# Patient Record
Sex: Female | Born: 1970
Health system: Southern US, Community
[De-identification: ages and names within clinical notes are randomized; demographics above are authoritative.]

## PROBLEM LIST (undated history)

## (undated) DIAGNOSIS — D869 Sarcoidosis, unspecified: Secondary | ICD-10-CM

## (undated) DIAGNOSIS — I10 Essential (primary) hypertension: Secondary | ICD-10-CM

## (undated) HISTORY — PX: CORNEAL TRANSPLANT: SHX108

## (undated) HISTORY — PX: TUBAL LIGATION: SHX77

---

## 2015-12-30 DIAGNOSIS — Z1231 Encounter for screening mammogram for malignant neoplasm of breast: Secondary | ICD-10-CM | POA: Diagnosis not present

## 2016-01-11 DIAGNOSIS — N3941 Urge incontinence: Secondary | ICD-10-CM | POA: Diagnosis not present

## 2016-01-11 DIAGNOSIS — R5383 Other fatigue: Secondary | ICD-10-CM | POA: Diagnosis not present

## 2016-01-14 DIAGNOSIS — N920 Excessive and frequent menstruation with regular cycle: Secondary | ICD-10-CM | POA: Diagnosis not present

## 2016-02-04 DIAGNOSIS — N760 Acute vaginitis: Secondary | ICD-10-CM | POA: Diagnosis not present

## 2016-02-24 DIAGNOSIS — B379 Candidiasis, unspecified: Secondary | ICD-10-CM | POA: Diagnosis not present

## 2016-02-24 DIAGNOSIS — B372 Candidiasis of skin and nail: Secondary | ICD-10-CM | POA: Diagnosis not present

## 2016-06-30 DIAGNOSIS — B373 Candidiasis of vulva and vagina: Secondary | ICD-10-CM | POA: Diagnosis not present

## 2016-06-30 DIAGNOSIS — Z7251 High risk heterosexual behavior: Secondary | ICD-10-CM | POA: Diagnosis not present

## 2016-06-30 DIAGNOSIS — I10 Essential (primary) hypertension: Secondary | ICD-10-CM | POA: Diagnosis not present

## 2016-09-25 DIAGNOSIS — S0502XA Injury of conjunctiva and corneal abrasion without foreign body, left eye, initial encounter: Secondary | ICD-10-CM | POA: Diagnosis not present

## 2016-09-25 DIAGNOSIS — H5712 Ocular pain, left eye: Secondary | ICD-10-CM | POA: Diagnosis not present

## 2016-09-25 DIAGNOSIS — I10 Essential (primary) hypertension: Secondary | ICD-10-CM | POA: Diagnosis not present

## 2016-09-25 DIAGNOSIS — D869 Sarcoidosis, unspecified: Secondary | ICD-10-CM | POA: Diagnosis not present

## 2016-09-25 DIAGNOSIS — H5711 Ocular pain, right eye: Secondary | ICD-10-CM | POA: Diagnosis not present

## 2017-01-01 DIAGNOSIS — H109 Unspecified conjunctivitis: Secondary | ICD-10-CM | POA: Diagnosis not present

## 2017-01-03 ENCOUNTER — Encounter (HOSPITAL_COMMUNITY): Payer: Self-pay | Admitting: *Deleted

## 2017-01-03 ENCOUNTER — Emergency Department (HOSPITAL_COMMUNITY)
Admission: EM | Admit: 2017-01-03 | Discharge: 2017-01-04 | Disposition: A | Discharge status: Home / Self Care | Payer: Medicare Other | Attending: Emergency Medicine | Admitting: Emergency Medicine

## 2017-01-03 DIAGNOSIS — H209 Unspecified iridocyclitis: Secondary | ICD-10-CM | POA: Insufficient documentation

## 2017-01-03 DIAGNOSIS — I10 Essential (primary) hypertension: Secondary | ICD-10-CM | POA: Diagnosis not present

## 2017-01-03 DIAGNOSIS — H578 Other specified disorders of eye and adnexa: Secondary | ICD-10-CM | POA: Diagnosis present

## 2017-01-03 HISTORY — DX: Essential (primary) hypertension: I10

## 2017-01-03 HISTORY — DX: Sarcoidosis, unspecified: D86.9

## 2017-01-03 MED ORDER — TIMOLOL MALEATE 0.5 % OP SOLN
1.0000 [drp] | Freq: Two times a day (BID) | OPHTHALMIC | Status: DC
  Administered 2017-01-04: 1 [drp] via OPHTHALMIC
  Filled 2017-01-03: qty 5

## 2017-01-03 MED ORDER — TETRACAINE HCL 0.5 % OP SOLN
1.0000 [drp] | Freq: Once | OPHTHALMIC | Status: DC
  Filled 2017-01-03: qty 4

## 2017-01-03 MED ORDER — FLUORESCEIN SODIUM 0.6 MG OP STRP
1.0000 | ORAL_STRIP | Freq: Once | OPHTHALMIC | Status: DC
  Filled 2017-01-03: qty 1

## 2017-01-03 MED ORDER — PREDNISOLONE ACETATE 1 % OP SUSP
1.0000 [drp] | Freq: Once | OPHTHALMIC | Status: AC
  Administered 2017-01-04: 1 [drp] via OPHTHALMIC
  Filled 2017-01-03: qty 1

## 2017-01-03 NOTE — ED Notes (Signed)
Pt reports that her vision is very blurry, unable to see anything definite with her right eye, 20/800 in left eye and both eyes,

## 2017-01-03 NOTE — ED Provider Notes (Signed)
[facility] DEPT Provider Note   CSN: [id number] Arrival date & time: 01/03/17  2258     History   Chief Complaint Chief Complaint  Patient presents with  . Eye Problem    HPI Marie Wright is a 46 y.o. female.  Marie Wright is a 46 y.o. Female with a history of hypertension, sarcoidosis and a left corneal transplant who presents to the emergency department complaining of bilateral eye pain as well as revision, and right eye redness. She reports this has been ongoing for 6 days. She reports she was seen at [facility] ER 6 days ago and diagnosed with conjunctivitis. She was given diclofenac eyedrops and gentamicin eyedrops and reports that this has not helped her symptoms. She reports she feels these have made them worse. She reports her vision is blurry. No double vision. She does not wear contacts or glasses. 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  She has had a left corneal transplant. She is followed by an ophthalmologist in [facility]. She denies eye foreign body sensation. She denies fevers, double vision, headache, or eye injury.    The history is provided by the patient. No language interpreter was used.  Eye Problem   Associated symptoms include eye redness. Pertinent negatives include no photophobia.    Past Medical History:  Diagnosis Date  . Hypertension   . Sarcoidosis ([place])     There are no active problems to display for this patient.   Past Surgical History:  Procedure Laterality Date  . CORNEAL TRANSPLANT Left   . TUBAL LIGATION      OB History    No data available       Home Medications    Prior to Admission medications   Not on File    Family History History reviewed. No pertinent family history.  Social History Social History  Substance Use Topics  . Smoking status: Never Smoker  . Smokeless tobacco: Never Used  . Alcohol use No     Allergies   Patient has no known allergies.   Review  of Systems Review of Systems  Constitutional: Negative for chills and fever.  Eyes: Positive for pain, redness and visual disturbance. Negative for photophobia.  Skin: Negative for rash.  Neurological: Negative for headaches.     Physical Exam Updated Vital Signs BP (!) 180/114 (BP Location: Right Arm)   Pulse 77   Temp 97.8 F (36.6 C) (Oral)   Resp 20   Ht 5\' 2"  (1.575 m)   Wt 89.4 kg   SpO2 95%   BMI 36.03 kg/m   Physical Exam  Constitutional: She appears well-developed and well-nourished. No distress.  HENT:  Head: Normocephalic and atraumatic.  Right Ear: External ear normal.  Left Ear: External ear normal.  Eyes: EOM are normal. Pupils are equal, round, and reactive to light. Right eye exhibits no discharge. Left eye exhibits no discharge.  Right eye conjunctival injection. EOMs intact. No lid edema or erythema. No proptosis. Right consensual pain noted on exam. Right Pupil is minimally reactive.  Bilateral eyes were anesthetized with tetracaine and stained with fluorescein. No forcing uptake on exam. No corneal abrasions or ulcers. Pressures are checked with Tono-Pen. Elevated pressures in her right eye with pressures between 52 and 72 mmHg. Left eye has pressures of 19 mmHg.   Neck: Neck supple.  Pulmonary/Chest: Effort normal. No respiratory distress.  Neurological: She is alert. Coordination normal.  Skin: Skin is warm and dry.  Capillary refill takes less than 2 seconds. No rash noted. She is not diaphoretic. No erythema. No pallor.  Psychiatric: She has a normal mood and affect. Her behavior is normal.  Nursing note and vitals reviewed.    ED Treatments / Results  Labs (all labs ordered are listed, but only abnormal results are displayed) Labs Reviewed - No data to display  EKG  EKG Interpretation None       Radiology No results found.  Procedures Procedures (including critical care time)  Medications Ordered in ED Medications  fluorescein  ophthalmic strip 1 strip (not administered)  tetracaine (PONTOCAINE) 0.5 % ophthalmic solution 1 drop (not administered)  prednisoLONE acetate (PRED FORTE) 1 % ophthalmic suspension 1 drop (not administered)  timolol (TIMOPTIC) 0.5 % ophthalmic solution 1 drop (not administered)  HYDROcodone-acetaminophen (NORCO/VICODIN) 5-325 MG per tablet 2 tablet (not administered)     Initial Impression / Assessment and Plan / ED Course  I have reviewed the triage vital signs and the nursing notes.  Pertinent labs & imaging results that were available during my care of the patient were reviewed by me and considered in my medical decision making (see chart for details).  Clinical Course    This is a 46 y.o. Female with a history of hypertension, sarcoidosis and a left corneal transplant who presents to the emergency department complaining of bilateral eye pain as well as revision, and right eye redness. She reports this has been ongoing for 6 days. She reports she was seen at [facility] ER 6 days ago and diagnosed with conjunctivitis. She was given diclofenac eyedrops and gentamicin eyedrops and reports that this has not helped her symptoms. She reports she feels these have made them worse. She reports her vision is blurry. No double vision. She does not wear contacts or glasses. 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  She has had a left corneal transplant. She is followed by an ophthalmologist in [facility].  On exam the patient is afebrile nontoxic appearing. She is right eye conjunctival injection. No lid erythema or edema. Right pupil is sluggish to respond. Vision is greatly diminished in her right eye. She reports only see shapes. Bilateral eyes were anesthetized with tetracaine and stained with fluorescein. No forcing uptake on exam. No corneal abrasions or ulcers. Patient has elevated pressures in her right eye. Pressures between 52 and 72 mmHg obtained. Left eye with  her corneal transplant has normal pressures of 19 mmHg. I consulted with ophthalmologist Dr. Alanda Slim. He believes the patient has uveitis. He would like her to use Timolol eye drops twice a day and predisolone eye drops four times per day. He will see her in office first thing in the morning. Patient agrees with this plan. Patient provided with eye drops in the ER tonight and discharged home with bottles and instructions. I advised the patient to follow-up with their primary care provider this week. I advised the patient to return to the emergency department with new or worsening symptoms or new concerns. The patient verbalized understanding and agreement with plan.   This patient was discussed with and evaluated by Dr. Christy Gentles who agrees with assessment and plan.   Final Clinical Impressions(s) / ED Diagnoses   Final diagnoses:  Uveitis    New Prescriptions New Prescriptions   No medications on file     Waynetta Pean, PA-C 01/04/17 0106    Ripley Fraise, MD 01/04/17 [phone]

## 2017-01-03 NOTE — ED Triage Notes (Signed)
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

## 2017-01-04 DIAGNOSIS — H209 Unspecified iridocyclitis: Secondary | ICD-10-CM | POA: Diagnosis not present

## 2017-01-04 MED ORDER — PREDNISOLONE ACETATE 1 % OP SUSP
OPHTHALMIC | Status: AC
  Filled 2017-01-04: qty 5

## 2017-01-04 MED ORDER — HYDROCODONE-ACETAMINOPHEN 5-325 MG PO TABS
2.0000 | ORAL_TABLET | Freq: Once | ORAL | Status: AC
Start: 2017-01-04 — End: 2017-01-04
  Administered 2017-01-04: 2 via ORAL
  Filled 2017-01-04: qty 2

## 2017-01-04 NOTE — Discharge Instructions (Signed)
Please use Timolol drops twice a day in your right eye.  Please use Prednisolone drops four times a day in your right eye.

## 2017-01-04 NOTE — ED Provider Notes (Signed)
Patient seen/examined in the Emergency Department in conjunction with Midlevel Provider Dans[place] Patient reports with right eye pain for several days Exam : awake/alert, right eye is erythematous, pupil minimally reactive and cloudy Plan: discussion with opthalmology dr Alanda Slim by [place] to treat with pred forte, timolol and he will see patient later this morning.  His suspicion is this represents acute uveitis Pt updated on plan She is awake/alert, comfortable appearing at this time     Ripley Fraise, MD 01/04/17 0022

## 2017-01-05 DIAGNOSIS — H40031 Anatomical narrow angle, right eye: Secondary | ICD-10-CM | POA: Diagnosis not present

## 2017-01-12 DIAGNOSIS — H209 Unspecified iridocyclitis: Secondary | ICD-10-CM | POA: Diagnosis not present

## 2017-01-17 ENCOUNTER — Ambulatory Visit: Payer: Medicare Other | Attending: Internal Medicine | Admitting: Physician Assistant

## 2017-01-17 VITALS — BP 145/92 | HR 90 | Temp 98.1°F | Resp 18 | Ht 62.0 in | Wt 215.8 lb

## 2017-01-17 DIAGNOSIS — Z09 Encounter for follow-up examination after completed treatment for conditions other than malignant neoplasm: Secondary | ICD-10-CM | POA: Insufficient documentation

## 2017-01-17 DIAGNOSIS — R5383 Other fatigue: Secondary | ICD-10-CM | POA: Diagnosis not present

## 2017-01-17 DIAGNOSIS — H209 Unspecified iridocyclitis: Secondary | ICD-10-CM | POA: Insufficient documentation

## 2017-01-17 DIAGNOSIS — D869 Sarcoidosis, unspecified: Secondary | ICD-10-CM | POA: Diagnosis not present

## 2017-01-17 DIAGNOSIS — Z23 Encounter for immunization: Secondary | ICD-10-CM | POA: Diagnosis not present

## 2017-01-17 DIAGNOSIS — I10 Essential (primary) hypertension: Secondary | ICD-10-CM | POA: Diagnosis not present

## 2017-01-17 LAB — CBC WITH DIFFERENTIAL/PLATELET
BASOS ABS: 35 {cells}/uL (ref 0–200)
BASOS PCT: 1 %
EOS ABS: 105 {cells}/uL (ref 15–500)
Eosinophils Relative: 3 %
HCT: 35.2 % (ref 35.0–45.0)
Hemoglobin: 11.5 g/dL — ABNORMAL LOW (ref 11.7–15.5)
LYMPHS PCT: 22 %
Lymphs Abs: 770 cells/uL — ABNORMAL LOW (ref 850–3900)
MCH: 24.8 pg — ABNORMAL LOW (ref 27.0–33.0)
MCHC: 32.7 g/dL (ref 32.0–36.0)
MCV: 76 fL — AB (ref 80.0–100.0)
MPV: 8.9 fL (ref 7.5–12.5)
Monocytes Absolute: 455 cells/uL (ref 200–950)
Monocytes Relative: 13 %
Neutro Abs: 2135 cells/uL (ref 1500–7800)
Neutrophils Relative %: 61 %
Platelets: 235 10*3/uL (ref 140–400)
RBC: 4.63 MIL/uL (ref 3.80–5.10)
RDW: 15.8 % — AB (ref 11.0–15.0)
WBC: 3.5 10*3/uL — ABNORMAL LOW (ref 3.8–10.8)

## 2017-01-17 LAB — COMPREHENSIVE METABOLIC PANEL
ALBUMIN: 3.7 g/dL (ref 3.6–5.1)
ALK PHOS: 103 U/L (ref 33–115)
ALT: 10 U/L (ref 6–29)
AST: 16 U/L (ref 10–35)
BILIRUBIN TOTAL: 0.4 mg/dL (ref 0.2–1.2)
BUN: 12 mg/dL (ref 7–25)
CALCIUM: 9.5 mg/dL (ref 8.6–10.2)
CO2: 25 mmol/L (ref 20–31)
Chloride: 107 mmol/L (ref 98–110)
Creat: 0.92 mg/dL (ref 0.50–1.10)
Glucose, Bld: 82 mg/dL (ref 65–99)
Potassium: 4 mmol/L (ref 3.5–5.3)
Sodium: 141 mmol/L (ref 135–146)
Total Protein: 7.6 g/dL (ref 6.1–8.1)

## 2017-01-17 LAB — TSH: TSH: 2.36 mIU/L

## 2017-01-17 MED ORDER — LISINOPRIL-HYDROCHLOROTHIAZIDE 20-25 MG PO TABS: 1.0000 | ORAL_TABLET | Freq: Every day | ORAL | 3 refills | Status: DC

## 2017-01-17 MED FILL — LISINOPRIL-HCTZ 20-25 MG TA: 20-25 | 90 days supply | Qty: 90 | Fill #0

## 2017-01-17 NOTE — Progress Notes (Signed)
Marie Wright, is a 46 y.o. female  [id number]  [id number]  DOB - 05/12/1971  Subjective:  Chief Complaint and HPI: Marie Wright is a 46 y.o. female here today to establish care and for a follow up visit after being seen in the ED for Uveitis in the R eye.  She has a PMH of htn, L corneal transplant, and sarcoidosis.  She says she is compliant with her BP meds but she hasn't seen her PCP in a while and hasn't been monitoring her BP.  Her PCP and ophthalmologist were in  [facility].  She is changing her care to here.  It has been a while since she had blood work done.  Her only complaint today is L calf pain.  No new SOB.  No h/o DVT.  No erythema of calf pain.  She is seeing Dr. Alanda Slim for her uveitis and is taking/using the meds as directed.  She needs a referral for pulmonology in the area.  ED/Hospital notes reviewed.    ROS:   Constitutional:  No f/c, No night sweats, No unexplained weight loss. EENT:  Eye pain has improved.  Poor visual acuity without correction. No hearing changes. No mouth, throat, or ear problems.  Respiratory: No cough, No SOB Cardiac: No CP, no palpitations GI:  No abd pain, No N/V/D. GU: No Urinary s/sx Musculoskeletal: No joint pain.  +L calf pain Neuro: No headache, no dizziness, no motor weakness.  Skin: No rash Endocrine:  No polydipsia. No polyuria.  Psych: Denies SI/HI  No problems updated.  ALLERGIES: No Known Allergies  PAST MEDICAL HISTORY: Past Medical History:  Diagnosis Date  . Hypertension   . Sarcoidosis ([place])     MEDICATIONS AT HOME: Prior to Admission medications   Medication Sig Start Date End Date Taking? Authorizing Provider  acetaZOLAMIDE (DIAMOX) 500 MG capsule Take 500 mg by mouth 2 (two) times daily.   Yes Historical Provider, MD  albuterol (PROVENTIL HFA;VENTOLIN HFA) 108 (90 Base) MCG/ACT inhaler Inhale into the lungs every 6 (six) hours as needed for wheezing or shortness of breath.   Yes Historical  Provider, MD  brimonidine (ALPHAGAN) 0.2 % ophthalmic solution 3 (three) times daily.   Yes Historical Provider, MD  budesonide-formoterol (SYMBICORT) 160-4.5 MCG/ACT inhaler Inhale 2 puffs into the lungs 2 (two) times daily.   Yes Historical Provider, MD  dorzolamide (TRUSOPT) 2 % ophthalmic solution 1 drop 3 (three) times daily.   Yes Historical Provider, MD  prednisoLONE (ORAPRED) 15 MG/5ML solution Take by mouth daily before breakfast.   Yes Historical Provider, MD  prednisoLONE acetate (PRED FORTE) 1 % ophthalmic suspension 1 drop 4 (four) times daily.   Yes Historical Provider, MD  predniSONE (DELTASONE) 20 MG tablet Take 20 mg by mouth daily with breakfast.   Yes Historical Provider, MD  timolol (TIMOPTIC) 0.5 % ophthalmic solution 1 drop 2 (two) times daily.   Yes Historical Provider, MD  lisinopril-hydrochlorothiazide (PRINZIDE,ZESTORETIC) 20-25 MG tablet Take 1 tablet by mouth daily. 01/17/17   Argentina Donovan, PA-C     Objective:  EXAM:   Vitals:   01/17/17 0850  BP: (!) 145/92  Pulse: 90  Resp: 18  Temp: 98.1 F (36.7 C)  TempSrc: Oral  SpO2: 100%  Weight: 215 lb 12.8 oz (97.9 kg)  Height: 5\' 2"  (1.575 m)    General appearance : A&OX3. NAD. Non-toxic-appearing HEENT: Atraumatic and Normocephalic.  PERRLA. EOM intact. TM clear B. Mouth-MMM, post pharynx WNL w/o erythema, No PND. Neck:  supple, no JVD. No cervical lymphadenopathy. No thyromegaly Chest/Lungs:  Breathing-non-labored, Good air entry bilaterally, breath sounds normal without rales, rhonchi, or wheezing  CVS: S1 S2 regular, no murmurs, gallops, rubs  Extremities: Bilateral Lower Ext shows no edema, both legs are warm to touch with = pulse throughout.  No erythema of L calf.  No TTP with palpation.  Calves grossly appear same size.  Neg Homan's sign Neurology:  CN II-XII grossly intact, Non focal.   Psych:  TP linear. J/I WNL. Normal speech. Appropriate eye contact and affect.  Skin:  No Rash  Data  Review No results found for: HGBA1C   Assessment & Plan   1. Sarcoidosis ([place]) Stable Refer pulmonology-needs provider in [place] - CBC with Differential/Platelet  2. Fatigue, unspecified type - TSH  3. Hypertension, unspecified type Check blood pressure 2-3 times/week and record and bring to next visit. Stop Lisinopril 20mg  daily.  - lisinopril-hydrochlorothiazide (PRINZIDE,ZESTORETIC) 20-25 MG tablet; Take 1 tablet by mouth daily.  Dispense: 90 tablet; Refill: 3 - Comprehensive metabolic panel  4. Uveitis Continue to f/up with Dr Lance Muss appointment is Friday.  Patient have been counseled extensively about nutrition and exercise  Return in about 3 weeks (around 02/07/2017) for establish care/assign to PCP; f/up htn.  The patient was given clear instructions to go to ER or return to medical center if symptoms don't improve, worsen or new problems develop. The patient verbalized understanding. The patient was told to call to get lab results if they haven't heard anything in the next week.   Freeman Caldron, PA-C [facility] [place], [place]   01/17/2017, 9:04 AM

## 2017-01-17 NOTE — Patient Instructions (Signed)
Check blood pressure 2-3 times/week and record and bring to next visit 

## 2017-01-19 ENCOUNTER — Telehealth: Payer: Self-pay

## 2017-01-19 DIAGNOSIS — Z09 Encounter for follow-up examination after completed treatment for conditions other than malignant neoplasm: Secondary | ICD-10-CM | POA: Diagnosis not present

## 2017-01-19 NOTE — Telephone Encounter (Signed)
Contacted pt to go over lab results pt is aware of results and doesn't have any questions or concerns 

## 2017-02-07 ENCOUNTER — Encounter: Payer: Self-pay | Admitting: Family Medicine

## 2017-02-07 ENCOUNTER — Ambulatory Visit: Payer: Medicare Other | Attending: Family Medicine | Admitting: Family Medicine

## 2017-02-07 VITALS — BP 108/72 | HR 96 | Temp 97.3°F | Resp 18 | Ht 62.0 in | Wt 214.2 lb

## 2017-02-07 DIAGNOSIS — Z114 Encounter for screening for human immunodeficiency virus [HIV]: Secondary | ICD-10-CM

## 2017-02-07 DIAGNOSIS — Z23 Encounter for immunization: Secondary | ICD-10-CM

## 2017-02-07 DIAGNOSIS — I10 Essential (primary) hypertension: Secondary | ICD-10-CM | POA: Diagnosis not present

## 2017-02-07 DIAGNOSIS — G4700 Insomnia, unspecified: Secondary | ICD-10-CM | POA: Insufficient documentation

## 2017-02-07 DIAGNOSIS — Z79899 Other long term (current) drug therapy: Secondary | ICD-10-CM | POA: Insufficient documentation

## 2017-02-07 DIAGNOSIS — F5101 Primary insomnia: Secondary | ICD-10-CM | POA: Diagnosis not present

## 2017-02-07 LAB — BASIC METABOLIC PANEL WITH GFR
BUN: 24 mg/dL (ref 7–25)
CHLORIDE: 101 mmol/L (ref 98–110)
CO2: 26 mmol/L (ref 20–31)
Calcium: 9.7 mg/dL (ref 8.6–10.2)
Creat: 1.36 mg/dL — ABNORMAL HIGH (ref 0.50–1.10)
GFR, EST AFRICAN AMERICAN: 53 mL/min — AB (ref >=60)
GFR, EST NON AFRICAN AMERICAN: 46 mL/min — AB (ref >=60)
GLUCOSE: 90 mg/dL (ref 65–99)
POTASSIUM: 4 mmol/L (ref 3.5–5.3)
Sodium: 137 mmol/L (ref 135–146)

## 2017-02-07 MED ORDER — LISINOPRIL-HYDROCHLOROTHIAZIDE 20-25 MG PO TABS: 1.0000 | ORAL_TABLET | Freq: Every day | ORAL | 2 refills | Status: DC

## 2017-02-07 MED ORDER — TRAZODONE HCL 50 MG PO TABS: 25.0000 mg | ORAL_TABLET | Freq: Every evening | ORAL | 1 refills | Status: DC | PRN

## 2017-02-07 MED FILL — traZODone HCL 50 MG TABS: 50 | 30 days supply | Qty: 30 | Fill #0

## 2017-02-07 NOTE — Progress Notes (Signed)
Patient is here for F/UP HTN  Patient has not eaten today  Patient has taking her current meds today  Patient complains about swollen legs  Patient complains about no appetite been going on for 3 weeks now & also she stated that she cant sleep at nights

## 2017-02-07 NOTE — Progress Notes (Signed)
Subjective:  Patient ID: Marie Wright, female    DOB: 02/08/1971  Age: 46 y.o. MRN: [id number]  CC: Hypertension   HPI Marie Wright presents for hypertension and difficulty sleeping. She denies any chest pain, shortness of breath. She does report occasional swelling of the left lower extremities. Denies any pain or warmth of the extremity. She reports her long-standing due to her work. She is not a current smoker. She reports only sleeping about 3 hours per night for the last 2 months. Reports family of the stressor. She reports taking over-the-counter Tylenol PM and Advil PM with no relief of symptoms. She denies any SI/HI.    Outpatient Medications Prior to Visit  Medication Sig Dispense Refill  . acetaZOLAMIDE (DIAMOX) 500 MG capsule Take 500 mg by mouth 2 (two) times daily.    Marland Kitchen albuterol (PROVENTIL HFA;VENTOLIN HFA) 108 (90 Base) MCG/ACT inhaler Inhale into the lungs every 6 (six) hours as needed for wheezing or shortness of breath.    . brimonidine (ALPHAGAN) 0.2 % ophthalmic solution 3 (three) times daily.    . budesonide-formoterol (SYMBICORT) 160-4.5 MCG/ACT inhaler Inhale 2 puffs into the lungs 2 (two) times daily.    . dorzolamide (TRUSOPT) 2 % ophthalmic solution 1 drop 3 (three) times daily.    . prednisoLONE (ORAPRED) 15 MG/5ML solution Take by mouth daily before breakfast.    . prednisoLONE acetate (PRED FORTE) 1 % ophthalmic suspension 1 drop 4 (four) times daily.    . predniSONE (DELTASONE) 20 MG tablet Take 20 mg by mouth daily with breakfast.    . timolol (TIMOPTIC) 0.5 % ophthalmic solution 1 drop 2 (two) times daily.    Marland Kitchen lisinopril-hydrochlorothiazide (PRINZIDE,ZESTORETIC) 20-25 MG tablet Take 1 tablet by mouth daily. 90 tablet 3   No facility-administered medications prior to visit.     ROS Review of Systems  Eyes: Negative.   Respiratory: Negative.   Cardiovascular: Positive for leg swelling (occasional left leg only).  Gastrointestinal: Negative.     Skin: Negative.     Objective:  BP 108/72 (BP Location: Left Arm, Patient Position: Sitting, Cuff Size: Normal)   Pulse 96   Temp 97.3 F (36.3 C) (Oral)   Resp 18   Ht 5\' 2"  (1.575 m)   Wt 214 lb 3.2 oz (97.2 kg)   LMP 01/16/2017   SpO2 100%   BMI 39.18 kg/m   BP/Weight 02/07/2017 01/17/2017 [id number]  Systolic BP 123XX123 [id number]  Diastolic BP 72 92 [id number]  Wt. (Lbs) 214.2 215.8 197  BMI 39.18 39.47 36.03    Physical Exam  Eyes: Conjunctivae are normal. Pupils are equal, round, and reactive to light.  Neck: No JVD present.  Cardiovascular: Normal rate, regular rhythm, normal heart sounds and intact distal pulses.   No edema present.  Pulmonary/Chest: Effort normal and breath sounds normal.  Abdominal: Soft. Bowel sounds are normal.  Skin: Skin is warm and dry.  Nursing note and vitals reviewed.   Assessment & Plan:   Problem List Items Addressed This Visit      -Suggest elevation of extremity    Cardiovascular and Mediastinum   Hypertension - Primary   Relevant Medications   lisinopril-hydrochlorothiazide (PRINZIDE,ZESTORETIC) 20-25 MG tablet   Other Relevant Orders   BASIC METABOLIC PANEL WITH GFR (Completed)   Microalbumin/Creatinine Ratio, Urine (Completed)    Other Visit Diagnoses    Primary insomnia       Relevant Medications   traZODone (DESYREL) 50 MG tablet   Screening for HIV (human  immunodeficiency virus)       Relevant Orders   HIV antibody (Completed)   Need for vaccine for DT (diphtheria-tetanus)       Relevant Orders   Tdap vaccine greater than or equal to 7yo IM (Completed)      Meds ordered this encounter  Medications  . lisinopril-hydrochlorothiazide (PRINZIDE,ZESTORETIC) 20-25 MG tablet    Sig: Take 1 tablet by mouth daily.    Dispense:  30 tablet    Refill:  2    Order Specific Question:   Supervising Provider    Answer:   Tresa Garter [id number]  . traZODone (DESYREL) 50 MG tablet    Sig: Take 0.5-1 tablets (25-50 mg total) by  mouth at bedtime as needed for sleep.    Dispense:  30 tablet    Refill:  1    Order Specific Question:   Supervising Provider    Answer:   Tresa Garter [id number]    Follow-up: Return in about 3 months (around 05/07/2017) for Hypertension .   Alfonse Spruce FNP

## 2017-02-07 NOTE — Patient Instructions (Addendum)
Trazodone tablets What is this medicine? TRAZODONE (TRAZ oh done) is used to treat depression. This medicine may be used for other purposes; ask your health care provider or pharmacist if you have questions. COMMON BRAND NAME(S): Desyrel What should I tell my health care provider before I take this medicine? They need to know if you have any of these conditions: -attempted suicide or thinking about it -bipolar disorder -bleeding problems -glaucoma -heart disease, or previous heart attack -irregular heart beat -kidney or liver disease -low levels of sodium in the blood -an unusual or allergic reaction to trazodone, other medicines, foods, dyes or preservatives -pregnant or trying to get pregnant -breast-feeding How should I use this medicine? Take this medicine by mouth with a glass of water. Follow the directions on the prescription label. Take this medicine shortly after a meal or a light snack. Take your medicine at regular intervals. Do not take your medicine more often than directed. Do not stop taking this medicine suddenly except upon the advice of your doctor. Stopping this medicine too quickly may cause serious side effects or your condition may worsen. A special MedGuide will be given to you by the pharmacist with each prescription and refill. Be sure to read this information carefully each time. Talk to your pediatrician regarding the use of this medicine in children. Special care may be needed. Overdosage: If you think you have taken too much of this medicine contact a poison control center or emergency room at once. NOTE: This medicine is only for you. Do not share this medicine with others. What if I miss a dose? If you miss a dose, take it as soon as you can. If it is almost time for your next dose, take only that dose. Do not take double or extra doses. What may interact with this medicine? Do not take this medicine with any of the following medications: -certain medicines  for fungal infections like fluconazole, itraconazole, ketoconazole, posaconazole, voriconazole -cisapride -dofetilide -dronedarone -linezolid -MAOIs like Carbex, Eldepryl, Marplan, Nardil, and Parnate -mesoridazine -methylene blue (injected into a vein) -pimozide -saquinavir -thioridazine -ziprasidone This medicine may also interact with the following medications: -alcohol -antiviral medicines for HIV or AIDS -aspirin and aspirin-like medicines -barbiturates like phenobarbital -certain medicines for blood pressure, heart disease, irregular heart beat -certain medicines for depression, anxiety, or psychotic disturbances -certain medicines for migraine headache like almotriptan, eletriptan, frovatriptan, naratriptan, rizatriptan, sumatriptan, zolmitriptan -certain medicines for seizures like carbamazepine and phenytoin -certain medicines for sleep -certain medicines that treat or prevent blood clots like dalteparin, enoxaparin, warfarin -digoxin -fentanyl -lithium -NSAIDS, medicines for pain and inflammation, like ibuprofen or naproxen -other medicines that prolong the QT interval (cause an abnormal heart rhythm) -rasagiline -supplements like St. John's wort, kava kava, valerian -tramadol -tryptophan This list may not describe all possible interactions. Give your health care provider a list of all the medicines, herbs, non-prescription drugs, or dietary supplements you use. Also tell them if you smoke, drink alcohol, or use illegal drugs. Some items may interact with your medicine. What should I watch for while using this medicine? Tell your doctor if your symptoms do not get better or if they get worse. Visit your doctor or health care professional for regular checks on your progress. Because it may take several weeks to see the full effects of this medicine, it is important to continue your treatment as prescribed by your doctor. Patients and their families should watch out for new  or worsening thoughts of suicide or depression. Also   watch out for sudden changes in feelings such as feeling anxious, agitated, panicky, irritable, hostile, aggressive, impulsive, severely restless, overly excited and hyperactive, or not being able to sleep. If this happens, especially at the beginning of treatment or after a change in dose, call your health care professional. Dennis Bast may get drowsy or dizzy. Do not drive, use machinery, or do anything that needs mental alertness until you know how this medicine affects you. Do not stand or sit up quickly, especially if you are an older patient. This reduces the risk of dizzy or fainting spells. Alcohol may interfere with the effect of this medicine. Avoid alcoholic drinks. This medicine may cause dry eyes and blurred vision. If you wear contact lenses you may feel some discomfort. Lubricating drops may help. See your eye doctor if the problem does not go away or is severe. Your mouth may get dry. Chewing sugarless gum, sucking hard candy and drinking plenty of water may help. Contact your doctor if the problem does not go away or is severe. What side effects may I notice from receiving this medicine? Side effects that you should report to your doctor or health care professional as soon as possible: -allergic reactions like skin rash, itching or hives, swelling of the face, lips, or tongue -elevated mood, decreased need for sleep, racing thoughts, impulsive behavior -confusion -fast, irregular heartbeat -feeling faint or lightheaded, falls -feeling agitated, angry, or irritable -loss of balance or coordination -painful or prolonged erections -restlessness, pacing, inability to keep still -suicidal thoughts or other mood changes -tremors -trouble sleeping -seizures -unusual bleeding or bruising Side effects that usually do not require medical attention (report to your doctor or health care professional if they continue or are bothersome): -change in  sex drive or performance -change in appetite or weight -constipation -headache -muscle aches or pains -nausea This list may not describe all possible side effects. Call your doctor for medical advice about side effects. You may report side effects to FDA at 1-800-FDA-1088. Where should I keep my medicine? Keep out of the reach of children. Store at room temperature between 15 and 30 degrees C (59 to 86 degrees F). Protect from light. Keep container tightly closed. Throw away any unused medicine after the expiration date. NOTE: This sheet is a summary. It may not cover all possible information. If you have questions about this medicine, talk to your doctor, pharmacist, or health care provider.  2017 Elsevier/Gold Standard (2016-05-11 16:57:05)  Insomnia Insomnia is a sleep disorder that makes it difficult to fall asleep or to stay asleep. Insomnia can cause tiredness (fatigue), low energy, difficulty concentrating, mood swings, and poor performance at work or school. There are three different ways to classify insomnia:  Difficulty falling asleep.  Difficulty staying asleep.  Waking up too early in the morning. Any type of insomnia can be long-term (chronic) or short-term (acute). Both are common. Short-term insomnia usually lasts for three months or less. Chronic insomnia occurs at least three times a week for longer than three months. What are the causes? Insomnia may be caused by another condition, situation, or substance, such as:  Anxiety.  Certain medicines.  Gastroesophageal reflux disease (GERD) or other gastrointestinal conditions.  Asthma or other breathing conditions.  Restless legs syndrome, sleep apnea, or other sleep disorders.  Chronic pain.  Menopause. This may include hot flashes.  Stroke.  Abuse of alcohol, tobacco, or illegal drugs.  Depression.  Caffeine.  Neurological disorders, such as Alzheimer disease.  An overactive thyroid  (  hyperthyroidism). The cause of insomnia may not be known. What increases the risk? Risk factors for insomnia include:  Gender. Women are more commonly affected than men.  Age. Insomnia is more common as you get older.  Stress. This may involve your professional or personal life.  Income. Insomnia is more common in people with lower income.  Lack of exercise.  Irregular work schedule or night shifts.  Traveling between different time zones. What are the signs or symptoms? If you have insomnia, trouble falling asleep or trouble staying asleep is the main symptom. This may lead to other symptoms, such as:  Feeling fatigued.  Feeling nervous about going to sleep.  Not feeling rested in the morning.  Having trouble concentrating.  Feeling irritable, anxious, or depressed. How is this treated? Treatment for insomnia depends on the cause. If your insomnia is caused by an underlying condition, treatment will focus on addressing the condition. Treatment may also include:  Medicines to help you sleep.  Counseling or therapy.  Lifestyle adjustments. Follow these instructions at home:  Take medicines only as directed by your health care provider.  Keep regular sleeping and waking hours. Avoid naps.  Keep a sleep diary to help you and your health care provider figure out what could be causing your insomnia. Include:  When you sleep.  When you wake up during the night.  How well you sleep.  How rested you feel the next day.  Any side effects of medicines you are taking.  What you eat and drink.  Make your bedroom a comfortable place where it is easy to fall asleep:  Put up shades or special blackout curtains to block light from outside.  Use a white noise machine to block noise.  Keep the temperature cool.  Exercise regularly as directed by your health care provider. Avoid exercising right before bedtime.  Use relaxation techniques to manage stress. Ask your  health care provider to suggest some techniques that may work well for you. These may include:  Breathing exercises.  Routines to release muscle tension.  Visualizing peaceful scenes.  Cut back on alcohol, caffeinated beverages, and cigarettes, especially close to bedtime. These can disrupt your sleep.  Do not overeat or eat spicy foods right before bedtime. This can lead to digestive discomfort that can make it hard for you to sleep.  Limit screen use before bedtime. This includes:  Watching TV.  Using your smartphone, tablet, and computer.  Stick to a routine. This can help you fall asleep faster. Try to do a quiet activity, brush your teeth, and go to bed at the same time each night.  Get out of bed if you are still awake after 15 minutes of trying to sleep. Keep the lights down, but try reading or doing a quiet activity. When you feel sleepy, go back to bed.  Make sure that you drive carefully. Avoid driving if you feel very sleepy.  Keep all follow-up appointments as directed by your health care provider. This is important. Contact a health care provider if:  You are tired throughout the day or have trouble in your daily routine due to sleepiness.  You continue to have sleep problems or your sleep problems get worse. Get help right away if:  You have serious thoughts about hurting yourself or someone else. This information is not intended to replace advice given to you by your health care provider. Make sure you discuss any questions you have with your health care provider. Document Released: 12/08/2000  Document Revised: 05/12/2016 Document Reviewed: 09/11/2014 Elsevier Interactive Patient Education  2017 Elsevier Inc.  Hypertension Hypertension is another name for high blood pressure. High blood pressure forces your heart to work harder to pump blood. A blood pressure reading has two numbers, which includes a higher number over a lower number (example: 110/72). Follow  these instructions at home:  Have your blood pressure rechecked by your doctor.  Only take medicine as told by your doctor. Follow the directions carefully. The medicine does not work as well if you skip doses. Skipping doses also puts you at risk for problems.  Do not smoke.  Monitor your blood pressure at home as told by your doctor. Contact a doctor if:  You think you are having a reaction to the medicine you are taking.  You have repeat headaches or feel dizzy.  You have puffiness (swelling) in your ankles.  You have trouble with your vision. Get help right away if:  You get a very bad headache and are confused.  You feel weak, numb, or faint.  You get chest or belly (abdominal) pain.  You throw up (vomit).  You cannot breathe very well. This information is not intended to replace advice given to you by your health care provider. Make sure you discuss any questions you have with your health care provider. Document Released: 05/29/2008 Document Revised: 05/18/2016 Document Reviewed: 10/03/2013 Elsevier Interactive Patient Education  2017 [employer].

## 2017-02-08 LAB — HIV ANTIBODY (ROUTINE TESTING W REFLEX): HIV 1&2 Ab, 4th Generation: NONREACTIVE

## 2017-02-08 LAB — MICROALBUMIN / CREATININE URINE RATIO
Creatinine, Urine: 116 mg/dL (ref 20–320)
MICROALB/CREAT RATIO: 3 ug/mg{creat} (ref <30)
Microalb, Ur: 0.3 mg/dL

## 2017-02-13 ENCOUNTER — Other Ambulatory Visit: Payer: Self-pay | Admitting: Family Medicine

## 2017-02-13 DIAGNOSIS — I10 Essential (primary) hypertension: Secondary | ICD-10-CM

## 2017-02-14 ENCOUNTER — Telehealth: Payer: Self-pay

## 2017-02-14 NOTE — Telephone Encounter (Signed)
CMA call to go over lab results  Patient did not answer but cma left a VM stating the results & if have any questions just to call me back

## 2017-02-14 NOTE — Telephone Encounter (Signed)
-----   Message from Alfonse Spruce, [place] sent at 02/13/2017  7:16 PM EST ----- -HIV is negative.  -Labs that evaluate your kidney function were elevated. Take medications for blood pressure as prescribed. Schedule appointment for lab recheck in 3 months.  -Microalbumin/creatinine ratio level was normal. This tests for protein in your urine that can indicate early signs of kidney damage.

## 2017-02-19 ENCOUNTER — Ambulatory Visit (INDEPENDENT_AMBULATORY_CARE_PROVIDER_SITE_OTHER): Payer: Medicare Other | Admitting: Internal Medicine

## 2017-02-19 ENCOUNTER — Telehealth: Payer: Self-pay | Admitting: Internal Medicine

## 2017-02-19 ENCOUNTER — Telehealth: Payer: Self-pay | Admitting: Family Medicine

## 2017-02-19 ENCOUNTER — Encounter: Payer: Self-pay | Admitting: Internal Medicine

## 2017-02-19 ENCOUNTER — Ambulatory Visit (INDEPENDENT_AMBULATORY_CARE_PROVIDER_SITE_OTHER)
Admission: RE | Admit: 2017-02-19 | Discharge: 2017-02-19 | Disposition: A | Discharge status: Home / Self Care | Payer: Medicare Other | Source: Ambulatory Visit | Attending: Internal Medicine | Admitting: Internal Medicine

## 2017-02-19 ENCOUNTER — Other Ambulatory Visit: Payer: Self-pay | Admitting: Family Medicine

## 2017-02-19 VITALS — BP 126/80 | HR 90 | Ht 62.0 in | Wt 215.0 lb

## 2017-02-19 DIAGNOSIS — R058 Other specified cough: Secondary | ICD-10-CM | POA: Insufficient documentation

## 2017-02-19 DIAGNOSIS — D869 Sarcoidosis, unspecified: Secondary | ICD-10-CM

## 2017-02-19 DIAGNOSIS — R05 Cough: Secondary | ICD-10-CM | POA: Insufficient documentation

## 2017-02-19 DIAGNOSIS — I10 Essential (primary) hypertension: Secondary | ICD-10-CM | POA: Diagnosis not present

## 2017-02-19 DIAGNOSIS — R0602 Shortness of breath: Secondary | ICD-10-CM | POA: Diagnosis not present

## 2017-02-19 MED ORDER — VALSARTAN-HYDROCHLOROTHIAZIDE 160-25 MG PO TABS: 1.0000 | ORAL_TABLET | Freq: Every day | ORAL | 11 refills | Status: DC

## 2017-02-19 NOTE — Telephone Encounter (Signed)
Pt. Called requesting to speak with her nurse regarding her results. Please f/u

## 2017-02-19 NOTE — Telephone Encounter (Signed)
CMA call to go over lab results   Patient was aware and i

## 2017-02-19 NOTE — Patient Instructions (Addendum)
Stop lisinopril and start valsartan 160-25 mg one daily and as your breathing and cough improve see if you can stop the symbicort and just use the albuterol as needed  Adjust your prednisone to whatever your eye doctor records  GERD (REFLUX)  is an extremely common cause of respiratory symptoms just like yours , many times with no obvious heartburn at all.    It can be treated with medication, but also with lifestyle changes including elevation of the head of your bed (ideally with 6 inch  bed blocks),  Smoking cessation, avoidance of late meals, excessive alcohol, and avoid fatty foods, chocolate, peppermint, colas, red wine, and acidic juices such as orange juice.  NO MINT OR MENTHOL PRODUCTS SO NO COUGH DROPS   USE SUGARLESS CANDY INSTEAD (Jolley ranchers or Stover's or Life Savers) or even ice chips will also do - the key is to swallow to prevent all throat clearing. NO OIL BASED VITAMINS - use powdered substitutes.    Please remember to go to the  x-ray department downstairs in the basement  for your tests - we will call you with the results when they are available.     Please schedule a follow up office visit in 6 weeks, call sooner if needed with PFTs and all medications in hand

## 2017-02-19 NOTE — Telephone Encounter (Signed)
Pt would like to switch providers. She is currently seeing MW and wants to switch to [facility].  MW - please advise if this switch is okay. Thanks.

## 2017-02-19 NOTE — Assessment & Plan Note (Addendum)
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  For now therefore I recommend the patient maintain  The lowest dose that controls her ocular symptoms per opthamology recs  Total time devoted to counseling  > 50 % of initial 60 min office visit:  review case with pt/ discussion of options/alternatives/ personally creating written customized instructions  in presence of pt  then going over those specific  Instructions directly with the pt including how to use all of the meds but in particular covering each new medication in detail and the difference between the maintenance= "automatic" meds and the prns using an action plan format for the latter (If this problem/symptom => do that organization reading Left to right).  Please see AVS from this visit for a full list of these instructions which I personally wrote for this pt and  are unique to this visit.

## 2017-02-19 NOTE — Telephone Encounter (Signed)
She should go with the recommendations of her pulmonary doctor. He is recommending a trial of stopping her lisinopril for 6 weeks and replacing it with another medication for blood pressure. To evaluate her pulmonary symptoms. I will follow the recommendations of her pulmonologist and change her medications for now.

## 2017-02-19 NOTE — Telephone Encounter (Signed)
JN are you ok with taking this pt on as a patient?  thanks

## 2017-02-19 NOTE — Telephone Encounter (Signed)
Spoke with patient, she has concerns about her BP medication, she stated that her lung doctor , Dr. Tera Helper told her that she has problems because of her BP meds that she is taking and to stop taking those medication but the patient said that the medication is helping her to lower down her BP so she don't know what to do, to stop them or to continue to take them?

## 2017-02-19 NOTE — Telephone Encounter (Signed)
Notes Recorded by Tanda Rockers, MD on 02/19/2017 at 10:50 AM EST Call pt: Reviewed cxr and no acute change so no change in recommendations made at [facility] with pt. She is aware of results. Nothing further was needed.

## 2017-02-19 NOTE — Progress Notes (Signed)
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 Pulmonary Consult    Referred by Dr. Joselyn Arrow. 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 She has had increased fatigue, SOB and cough since Jan 2018.  Cough is non prod. She states she has chest tightness and pain in her back when she breathes.   doe x MMRC3 = can't walk 100 yards even at a slow pace at a flat grade s stopping due to sob   Cough is dry and worse at hs Still on 20 mg pred per day plus symbicort 160 but doesn't think it really helps / arthralgias resolved   No obvious day to day or daytime variability or assoc excess/ purulent sputum or mucus plugs or hemoptysis or cp or chest tightness, subjective wheeze or overt sinus or hb symptoms. No unusual exp hx or h/o childhood pna/ asthma or knowledge of premature birth.  Sleeping ok without nocturnal  or early am exacerbation  of respiratory  c/o's or need for noct saba. Also denies any obvious fluctuation of symptoms with weather or environmental changes or other aggravating or alleviating factors except as outlined above   Current Medications, Allergies, Complete Past Medical History, Past Surgical History, Family History, and Social History were reviewed in [employer] record.  ROS  The following are not active complaints unless bolded sore throat, dysphagia, dental problems, itching, sneezing,  nasal congestion or  excess/ purulent secretions, ear ache,   fever, chills, sweats, unintended wt loss, classically pleuritic or exertional cp,  orthopnea pnd or leg swelling, presyncope, palpitations, abdominal pain, anorexia, nausea, vomiting, diarrhea  or change in bowel or bladder habits, change in stools or urine, dysuria,hematuria,  rash, arthralgias, visual complaints, headache, numbness, weakness or ataxia or problems with walking or coordination,  change in mood/affect or memory.          Review of Systems     Objective:   Physical Exam  amb bf pleasant, min cushingnoid/ nad   Wt Readings from Last 3 Encounters:  02/19/17 215 lb (97.5 kg)  02/07/17 214 lb 3.2 oz (97.2 kg)  01/17/17 215 lb 12.8 oz (97.9 kg)    Vital signs reviewed  - Note on arrival 02 sats  96% on RA     HEENT: nl dentition, turbinates bilaterally, and oropharynx. Nl external ear canals without cough reflex   NECK :  without JVD/Nodes/TM/ nl carotid upstrokes bilaterally   LUNGS: no acc muscle use,  Nl contour chest which is clear to A and P bilaterally without cough on insp or exp maneuvers   CV:  RRR  no s3 or murmur or increase in P2, and no edema   ABD:  soft and nontender with nl inspiratory excursion in the supine position. No bruits or organomegaly appreciated, bowel sounds nl  MS:  Nl gait/ ext warm without deformities,  calf tenderness, cyanosis or clubbing No obvious joint restrictions   SKIN: warm and dry without lesions    NEURO:  alert, approp, nl sensorium with  no motor or cerebellar deficits apparent.    CXR PA and Lateral:   02/19/2017 :    I personally reviewed images and agree with radiology impression as follows:    No edema or consolidation. My impression: no evidence of active sarcoid      Assessment:

## 2017-02-19 NOTE — Telephone Encounter (Signed)
Fine with me

## 2017-02-19 NOTE — Assessment & Plan Note (Addendum)
Upper airway cough syndrome (previously labeled PNDS) , is  so named because it's frequently impossible to sort out how much is  CR/sinusitis with freq throat clearing (which can be related to primary GERD)   vs  causing  secondary (" extra esophageal")  GERD from wide swings in gastric pressure that occur with throat clearing, often  promoting self use of mint and menthol lozenges that reduce the lower esophageal sphincter tone and exacerbate the problem further in a cyclical fashion.   These are the same pts (now being labeled as having "irritable larynx syndrome" by some cough centers) who not infrequently have a history of having failed to tolerate ace inhibitors,  dry powder inhalers or biphosphonates or report having atypical/extraesophageal reflux symptoms that don't respond to standard doses of PPI  and are easily confused as having aecopd or asthma flares by even experienced allergists/ pulmonologists (myself included).   rec ACEi off/ gerd diet and f/u in 6 weeks

## 2017-02-19 NOTE — Assessment & Plan Note (Signed)
In the best review of chronic cough to date ( NEJM 2016 375 [id number]) ,  ACEi are now felt to cause cough in up to  20% of pts which is a 4 fold increase from previous reports and does not include the variety of non-specific complaints we see in pulmonary clinic in pts on ACEi but previously attributed to another dx like  Copd/asthma and  include PNDS, throat and chest congestion, "bronchitis", unexplained dyspnea and noct "strangling" sensations, and hoarseness, but also  atypical /refractory GERD symptoms like dysphagia and "bad heartburn"   The only way I know  to prove this is not an "ACEi Case" is a trial off ACEi x a minimum of 6 weeks then regroup.   Try diovan 160-25 daily and f/u in 6 weeks

## 2017-02-19 NOTE — Assessment & Plan Note (Signed)
Body mass index is 39.32  > no change Lab Results  Component Value Date   TSH 2.36 01/17/2017     Contributing to gerd risk/ doe/reviewed the need and the process to achieve and maintain neg calorie balance > defer f/u primary care including intermittently monitoring thyroid status

## 2017-02-19 NOTE — Progress Notes (Signed)
LMTCB

## 2017-02-20 NOTE — Progress Notes (Signed)
Pt aware, see phone note 2/26

## 2017-02-20 NOTE — Telephone Encounter (Signed)
CMA call to inform patient of the advice of her PCP & that she did a change on her BP medication & that is ready to pick up  Patient Verify DOB  Patient was aware and understood

## 2017-02-20 NOTE — Telephone Encounter (Signed)
lmtcb x1 for pt. 

## 2017-02-21 NOTE — Telephone Encounter (Signed)
Called and spoke with pt and she is aware of appt with JN in April.  Nothing further is needed.

## 2017-03-15 ENCOUNTER — Encounter: Payer: Self-pay | Admitting: Pulmonary Disease

## 2017-04-02 DIAGNOSIS — H30033 Focal chorioretinal inflammation, peripheral, bilateral: Secondary | ICD-10-CM | POA: Diagnosis not present

## 2017-04-02 DIAGNOSIS — H35033 Hypertensive retinopathy, bilateral: Secondary | ICD-10-CM | POA: Diagnosis not present

## 2017-04-02 DIAGNOSIS — H21543 Posterior synechiae (iris), bilateral: Secondary | ICD-10-CM | POA: Diagnosis not present

## 2017-04-02 DIAGNOSIS — D869 Sarcoidosis, unspecified: Secondary | ICD-10-CM | POA: Diagnosis not present

## 2017-04-02 DIAGNOSIS — Z947 Corneal transplant status: Secondary | ICD-10-CM | POA: Diagnosis not present

## 2017-04-02 DIAGNOSIS — I10 Essential (primary) hypertension: Secondary | ICD-10-CM | POA: Diagnosis not present

## 2017-04-02 DIAGNOSIS — H2513 Age-related nuclear cataract, bilateral: Secondary | ICD-10-CM | POA: Diagnosis not present

## 2017-04-03 ENCOUNTER — Institutional Professional Consult (permissible substitution): Payer: Medicare Other | Admitting: Pulmonary Disease

## 2017-04-05 ENCOUNTER — Telehealth: Payer: Self-pay | Admitting: Family Medicine

## 2017-04-05 NOTE — Telephone Encounter (Signed)
Medication Refill: Trazodone   Requesting medication to go to the [facility] on [address]

## 2017-04-06 ENCOUNTER — Other Ambulatory Visit: Payer: Self-pay | Admitting: Family Medicine

## 2017-04-06 DIAGNOSIS — F5101 Primary insomnia: Secondary | ICD-10-CM

## 2017-04-06 MED ORDER — TRAZODONE HCL 50 MG PO TABS: 25.0000 mg | ORAL_TABLET | Freq: Every evening | ORAL | 1 refills | Status: DC | PRN

## 2017-04-06 NOTE — Telephone Encounter (Signed)
Patient phone number would not work. MA unable to clarify which [address].

## 2017-04-06 NOTE — Telephone Encounter (Signed)
[facility] not [facility] on [address] [facility]. Will route to CMA to follow up. She can get refill of Trazodone 25- 50 mg QHSPRN. 30 count with 1 refill.

## 2017-04-10 NOTE — Telephone Encounter (Signed)
Please attempt to call patient again to get clarification [facility]. If phone number(s) still not working, refill  request cannot be completed.

## 2017-04-10 NOTE — Telephone Encounter (Signed)
CMA call but answer machine machine says call can not be completed at this time try later

## 2017-04-18 ENCOUNTER — Other Ambulatory Visit: Payer: Self-pay | Admitting: Family Medicine

## 2017-04-19 DIAGNOSIS — Z947 Corneal transplant status: Secondary | ICD-10-CM | POA: Diagnosis not present

## 2017-04-19 DIAGNOSIS — H21543 Posterior synechiae (iris), bilateral: Secondary | ICD-10-CM | POA: Diagnosis not present

## 2017-04-19 DIAGNOSIS — I10 Essential (primary) hypertension: Secondary | ICD-10-CM | POA: Diagnosis not present

## 2017-04-19 DIAGNOSIS — H2513 Age-related nuclear cataract, bilateral: Secondary | ICD-10-CM | POA: Diagnosis not present

## 2017-04-19 DIAGNOSIS — H35033 Hypertensive retinopathy, bilateral: Secondary | ICD-10-CM | POA: Diagnosis not present

## 2017-04-19 DIAGNOSIS — D869 Sarcoidosis, unspecified: Secondary | ICD-10-CM | POA: Diagnosis not present

## 2017-04-19 DIAGNOSIS — Z87891 Personal history of nicotine dependence: Secondary | ICD-10-CM | POA: Diagnosis not present

## 2017-04-19 DIAGNOSIS — H30033 Focal chorioretinal inflammation, peripheral, bilateral: Secondary | ICD-10-CM | POA: Diagnosis not present

## 2017-05-07 ENCOUNTER — Encounter: Payer: Self-pay | Admitting: Family Medicine

## 2017-05-07 ENCOUNTER — Ambulatory Visit: Payer: Medicare Other | Attending: Family Medicine | Admitting: Family Medicine

## 2017-05-07 VITALS — BP 129/90 | HR 91 | Temp 98.0°F | Resp 18 | Ht 62.0 in | Wt 207.6 lb

## 2017-05-07 DIAGNOSIS — H269 Unspecified cataract: Secondary | ICD-10-CM | POA: Diagnosis not present

## 2017-05-07 DIAGNOSIS — E669 Obesity, unspecified: Secondary | ICD-10-CM | POA: Insufficient documentation

## 2017-05-07 DIAGNOSIS — Z6837 Body mass index (BMI) 37.0-37.9, adult: Secondary | ICD-10-CM | POA: Insufficient documentation

## 2017-05-07 DIAGNOSIS — R252 Cramp and spasm: Secondary | ICD-10-CM | POA: Diagnosis not present

## 2017-05-07 DIAGNOSIS — H409 Unspecified glaucoma: Secondary | ICD-10-CM | POA: Diagnosis not present

## 2017-05-07 DIAGNOSIS — I10 Essential (primary) hypertension: Secondary | ICD-10-CM | POA: Diagnosis not present

## 2017-05-07 DIAGNOSIS — D869 Sarcoidosis, unspecified: Secondary | ICD-10-CM | POA: Diagnosis not present

## 2017-05-07 MED ORDER — ALBUTEROL SULFATE HFA 108 (90 BASE) MCG/ACT IN AERS: 2.0000 | INHALATION_SPRAY | Freq: Four times a day (QID) | RESPIRATORY_TRACT | 2 refills | Status: AC | PRN

## 2017-05-07 MED ORDER — VALSARTAN-HYDROCHLOROTHIAZIDE 160-25 MG PO TABS: 1.0000 | ORAL_TABLET | Freq: Every day | ORAL | 0 refills | Status: DC

## 2017-05-07 NOTE — Patient Instructions (Signed)
DASH Eating Plan DASH stands for "Dietary Approaches to Stop Hypertension." The DASH eating plan is a healthy eating plan that has been shown to reduce high blood pressure (hypertension). It may also reduce your risk for type 2 diabetes, heart disease, and stroke. The DASH eating plan may also help with weight loss. What are tips for following this plan? General guidelines   Avoid eating more than 2,300 mg (milligrams) of salt (sodium) a day. If you have hypertension, you may need to reduce your sodium intake to 1,500 mg a day.  Limit alcohol intake to no more than 1 drink a day for nonpregnant women and 2 drinks a day for men. One drink equals 12 oz of beer, 5 oz of wine, or 1 oz of hard liquor.  Work with your health care provider to maintain a healthy body weight or to lose weight. Ask what an ideal weight is for you.  Get at least 30 minutes of exercise that causes your heart to beat faster (aerobic exercise) most days of the week. Activities may include walking, swimming, or biking.  Work with your health care provider or diet and nutrition specialist (dietitian) to adjust your eating plan to your individual calorie needs. Reading food labels   Check food labels for the amount of sodium per serving. Choose foods with less than 5 percent of the Daily Value of sodium. Generally, foods with less than 300 mg of sodium per serving fit into this eating plan.  To find whole grains, look for the word "whole" as the first word in the ingredient list. Shopping   Buy products labeled as "low-sodium" or "no salt added."  Buy fresh foods. Avoid canned foods and premade or frozen meals. Cooking   Avoid adding salt when cooking. Use salt-free seasonings or herbs instead of table salt or sea salt. Check with your health care provider or pharmacist before using salt substitutes.  Do not fry foods. Cook foods using healthy methods such as baking, boiling, grilling, and broiling instead.  Cook with  heart-healthy oils, such as olive, canola, soybean, or sunflower oil. Meal planning    Eat a balanced diet that includes:  5 or more servings of fruits and vegetables each day. At each meal, try to fill half of your plate with fruits and vegetables.  Up to 6-8 servings of whole grains each day.  Less than 6 oz of lean meat, poultry, or fish each day. A 3-oz serving of meat is about the same size as a deck of cards. One egg equals 1 oz.  2 servings of low-fat dairy each day.  A serving of nuts, seeds, or beans 5 times each week.  Heart-healthy fats. Healthy fats called Omega-3 fatty acids are found in foods such as flaxseeds and coldwater fish, like sardines, salmon, and mackerel.  Limit how much you eat of the following:  Canned or prepackaged foods.  Food that is high in trans fat, such as fried foods.  Food that is high in saturated fat, such as fatty meat.  Sweets, desserts, sugary drinks, and other foods with added sugar.  Full-fat dairy products.  Do not salt foods before eating.  Try to eat at least 2 vegetarian meals each week.  Eat more home-cooked food and less restaurant, buffet, and fast food.  When eating at a restaurant, ask that your food be prepared with less salt or no salt, if possible. What foods are recommended? The items listed may not be a complete list. Talk   with your dietitian about what dietary choices are best for you. Grains  Whole-grain or whole-wheat bread. Whole-grain or whole-wheat pasta. Brown rice. [place]. Bulgur. Whole-grain and low-sodium cereals. Pita bread. Low-fat, low-sodium crackers. Whole-wheat flour tortillas. Vegetables  Fresh or frozen vegetables (raw, steamed, roasted, or grilled). Low-sodium or reduced-sodium tomato and vegetable juice. Low-sodium or reduced-sodium tomato sauce and tomato paste. Low-sodium or reduced-sodium canned vegetables. Fruits  All fresh, dried, or frozen fruit. Canned fruit in natural juice  (without added sugar). Meat and other protein foods  Skinless chicken or [place]. Ground chicken or [place]. Pork with fat trimmed off. Fish and seafood. Egg whites. Dried beans, peas, or lentils. Unsalted nuts, nut butters, and seeds. Unsalted canned beans. Lean cuts of beef with fat trimmed off. Low-sodium, lean deli meat. Dairy  Low-fat (1%) or fat-free (skim) milk. Fat-free, low-fat, or reduced-fat cheeses. Nonfat, low-sodium ricotta or cottage cheese. Low-fat or nonfat yogurt. Low-fat, low-sodium cheese. Fats and oils  Soft margarine without trans fats. Vegetable oil. Low-fat, reduced-fat, or light mayonnaise and salad dressings (reduced-sodium). Canola, safflower, olive, soybean, and sunflower oils. Avocado. Seasoning and other foods  Herbs. Spices. Seasoning mixes without salt. Unsalted popcorn and pretzels. Fat-free sweets. What foods are not recommended? The items listed may not be a complete list. Talk with your dietitian about what dietary choices are best for you. Grains  Baked goods made with fat, such as croissants, muffins, or some breads. Dry pasta or rice meal packs. Vegetables  Creamed or fried vegetables. Vegetables in a cheese sauce. Regular canned vegetables (not low-sodium or reduced-sodium). Regular canned tomato sauce and paste (not low-sodium or reduced-sodium). Regular tomato and vegetable juice (not low-sodium or reduced-sodium). Angie Fava. Olives. Fruits  Canned fruit in a light or heavy syrup. Fried fruit. Fruit in cream or butter sauce. Meat and other protein foods  Fatty cuts of meat. Ribs. Fried meat. Berniece Salines. Sausage. Bologna and other processed lunch meats. Salami. Fatback. Hotdogs. Bratwurst. Salted nuts and seeds. Canned beans with added salt. Canned or smoked fish. Whole eggs or egg yolks. Chicken or [place] with skin. Dairy  Whole or 2% milk, cream, and half-and-half. Whole or full-fat cream cheese. Whole-fat or sweetened yogurt. Full-fat cheese. Nondairy creamers.  Whipped toppings. Processed cheese and cheese spreads. Fats and oils  Butter. Stick margarine. Lard. Shortening. Ghee. Bacon fat. Tropical oils, such as coconut, palm kernel, or palm oil. Seasoning and other foods  Salted popcorn and pretzels. Onion salt, garlic salt, seasoned salt, table salt, and sea salt. Worcestershire sauce. Tartar sauce. Barbecue sauce. Teriyaki sauce. Soy sauce, including reduced-sodium. Steak sauce. Canned and packaged gravies. Fish sauce. Oyster sauce. Cocktail sauce. Horseradish that you find on the shelf. Ketchup. Mustard. Meat flavorings and tenderizers. Bouillon cubes. Hot sauce and Tabasco sauce. Premade or packaged marinades. Premade or packaged taco seasonings. Relishes. Regular salad dressings. Where to find more information:  National Heart, Lung, and [place]: https://wilson-eaton.com/  American Heart Association: www.heart.org Summary  The DASH eating plan is a healthy eating plan that has been shown to reduce high blood pressure (hypertension). It may also reduce your risk for type 2 diabetes, heart disease, and stroke.  With the DASH eating plan, you should limit salt (sodium) intake to 2,300 mg a day. If you have hypertension, you may need to reduce your sodium intake to 1,500 mg a day.  When on the DASH eating plan, aim to eat more fresh fruits and vegetables, whole grains, lean proteins, low-fat dairy, and heart-healthy fats.  Work  with your health care provider or diet and nutrition specialist (dietitian) to adjust your eating plan to your individual calorie needs. This information is not intended to replace advice given to you by your health care provider. Make sure you discuss any questions you have with your health care provider. Document Released: 11/30/2011 Document Revised: 12/04/2016 Document Reviewed: 12/04/2016 Elsevier Interactive Patient Education  2017 [employer]. Exercising to [employer] Exercising regularly is important. It has many  health benefits, such as:  Improving your overall fitness, flexibility, and endurance.  Increasing your bone density.  Helping with weight control.  Decreasing your body fat.  Increasing your muscle strength.  Reducing stress and tension.  Improving your overall health. In order to become healthy and stay healthy, it is recommended that you do moderate-intensity and vigorous-intensity exercise. You can tell that you are exercising at a moderate intensity if you have a higher heart rate and faster breathing, but you are still able to hold a conversation. You can tell that you are exercising at a vigorous intensity if you are breathing much harder and faster and cannot hold a conversation while exercising. How often should I exercise? Choose an activity that you enjoy and set realistic goals. Your health care provider can help you to make an activity plan that works for you. Exercise regularly as directed by your health care provider. This may include:  Doing resistance training twice each week, such as:  Push-ups.  Sit-ups.  Lifting weights.  Using resistance bands.  Doing a given intensity of exercise for a given amount of time. Choose from these options:  150 minutes of moderate-intensity exercise every week.  75 minutes of vigorous-intensity exercise every week.  A mix of moderate-intensity and vigorous-intensity exercise every week. Children, pregnant women, people who are out of shape, people who are overweight, and older adults may need to consult a health care provider for individual recommendations. If you have any sort of medical condition, be sure to consult your health care provider before starting a new exercise program. What are some exercise ideas? Some moderate-intensity exercise ideas include:  Walking at a rate of 1 mile in 15 minutes.  Biking.  Hiking.  Golfing.  Dancing. Some vigorous-intensity exercise ideas include:  Walking at a rate of at least  4.5 miles per hour.  Jogging or running at a rate of 5 miles per hour.  Biking at a rate of at least 10 miles per hour.  Lap swimming.  Roller-skating or in-line skating.  Cross-country skiing.  Vigorous competitive sports, such as football, basketball, and soccer.  Jumping rope.  Aerobic dancing. What are some everyday activities that can help me to get exercise?  Yard work, such as:  Psychologist, educational.  Raking and bagging leaves.  Washing and waxing your car.  Pushing a stroller.  Shoveling snow.  Gardening.  Washing windows or floors. How can I be more active in my day-to-day activities?  Use the stairs instead of the elevator.  Take a walk during your lunch break.  If you drive, park your car farther away from work or school.  If you take public transportation, get off one stop early and walk the rest of the way.  Make all of your phone calls while standing up and walking around.  Get up, stretch, and walk around every 30 minutes throughout the day. What guidelines should I follow while exercising?  Do not exercise so much that you hurt yourself, feel dizzy, or get very  short of breath.  Consult your health care provider before starting a new exercise program.  Wear comfortable clothes and shoes with good support.  Drink plenty of water while you exercise to prevent dehydration or heat stroke. Body water is lost during exercise and must be replaced.  Work out until you breathe faster and your heart beats faster. This information is not intended to replace advice given to you by your health care provider. Make sure you discuss any questions you have with your health care provider. Document Released: 01/13/2011 Document Revised: 05/18/2016 Document Reviewed: 05/14/2014 Elsevier Interactive Patient Education  2017 [employer].

## 2017-05-07 NOTE — Progress Notes (Signed)
Patient is here for f/up  Patient complains swollen eye headaches    Patient is out of her medication  Patient has not eaten for today

## 2017-05-07 NOTE — Progress Notes (Signed)
Subjective:  Patient ID: Marie Wright, female    DOB: 09/03/1971  Age: 46 y.o. MRN: [id number]  CC: Hypertension   HPI Marie Wright presents for   Hypertension: Patient here for follow-up of elevated blood pressure. She is exercising and is adherent to low salt diet. Report exercising 3 times a week , walking for 10 minutes per day. She reports dyspnea with physical activity. History of sarcoidosis which she reports discussing with her pulmonologist 1 month ago. Denies any worsening of symptoms.   Blood pressure She reports going to a pharmacy twice a week. BP range in theh 140's SBP and 90's DBP.  well controlled at home. Cardiac symptoms lower extremity edema. 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 Reports appointment with  opthalmologic 2 weeks ago  visit She reports  Patient denies chest pain, chest pressure/discomfort, claudication, dyspnea, palpitations and syncope. Cardiovascular risk factors: hypertension and obesity (BMI >= 30 kg/m2). Use of agents associated with hypertension: steroids. History of target organ damage: none.    Outpatient Medications Prior to Visit  Medication Sig Dispense Refill  . brimonidine (ALPHAGAN) 0.2 % ophthalmic solution 3 (three) times daily.    . budesonide-formoterol (SYMBICORT) 160-4.5 MCG/ACT inhaler Inhale 2 puffs into the lungs 2 (two) times daily.    . dorzolamide (TRUSOPT) 2 % ophthalmic solution 1 drop 3 (three) times daily.    . prednisoLONE acetate (PRED FORTE) 1 % ophthalmic suspension 1 drop 4 (four) times daily.    . predniSONE (DELTASONE) 20 MG tablet Take 20 mg by mouth daily with breakfast.    . timolol (TIMOPTIC) 0.5 % ophthalmic solution 1 drop 2 (two) times daily.    . traZODone (DESYREL) 50 MG tablet TAKE HALF TO 1 TABLET BY MOUTH AT BEDTIME AS NEEDED FOR SLEEP 30 tablet 0  . albuterol (PROVENTIL HFA;VENTOLIN HFA) 108 (90 Base) MCG/ACT  inhaler Inhale into the lungs every 6 (six) hours as needed for wheezing or shortness of breath.    . valsartan-hydrochlorothiazide (DIOVAN HCT) 160-25 MG tablet Take 1 tablet by mouth daily. 30 tablet 11   No facility-administered medications prior to visit.     ROS Review of Systems  Constitutional: Negative.   Eyes: Positive for visual disturbance.       History of cataracts and glaucoma.   Respiratory: Negative.   Cardiovascular: Negative.   Gastrointestinal: Negative.   Musculoskeletal: Positive for myalgias.  Skin: Negative.   Neurological: Negative.   Psychiatric/Behavioral: Negative.      Objective:  BP 129/90 (BP Location: Left Arm, Patient Position: Sitting, Cuff Size: Normal)   Pulse 91   Temp 98 F (36.7 C) (Oral)   Resp 18   Ht 5\' 2"  (1.575 m)   Wt 207 lb 9.6 oz (94.2 kg)   SpO2 97%   BMI 37.97 kg/m   BP/Weight 05/07/2017 02/19/2017 9/37/1696  Systolic BP 789 381 017  Diastolic BP 90 80 72  Wt. (Lbs) 207.6 215 214.2  BMI 37.97 39.32 39.18     Physical Exam  Constitutional: She is oriented to person, place, and time. She appears well-developed and well-nourished.  Eyes: Conjunctivae are normal. Pupils are equal, round, and reactive to light. Right eye exhibits no discharge. Left eye exhibits no discharge.  Neck: No JVD present.  Cardiovascular: Normal rate, regular rhythm, normal heart sounds and intact distal pulses.   Pulmonary/Chest: Effort normal and breath sounds normal.  Abdominal: Soft. Bowel sounds  are normal.  Neurological: She is alert and oriented to person, place, and time.  Skin: Skin is warm and dry.  Psychiatric: She has a normal mood and affect.  Nursing note and vitals reviewed.    Assessment & Plan:   Problem List Items Addressed This Visit      Cardiovascular and Mediastinum   Essential hypertension - Primary   Relevant Medications   Schedule BP recheck in 2 weeks with clinical RN.   If BP is greater than 90/60 (MAP 65 or  greater) but not less than 130/80 may add amlodipine 2.5 mg     po QD and recheck in another 2 weeks with clinic RN.   valsartan-hydrochlorothiazide (DIOVAN HCT) 160-25 MG tablet   Other Relevant Orders   Lipid Panel (Completed)     Other   Sarcoidosis   Relevant Medications   albuterol (PROVENTIL HFA;VENTOLIN HFA) 108 (90 Base) MCG/ACT inhaler    Other Visit Diagnoses    Muscle cramps       Relevant Orders   Basic Metabolic Panel (Completed)   CBC with Differential (Completed)      Meds ordered this encounter  Medications  . albuterol (PROVENTIL HFA;VENTOLIN HFA) 108 (90 Base) MCG/ACT inhaler    Sig: Inhale 2 puffs into the lungs every 6 (six) hours as needed for wheezing or shortness of breath.    Dispense:  1 Inhaler    Refill:  2    Order Specific Question:   Supervising Provider    Answer:   Tresa Garter [id number]  . valsartan-hydrochlorothiazide (DIOVAN HCT) 160-25 MG tablet    Sig: Take 1 tablet by mouth daily.    Dispense:  90 tablet    Refill:  0    Order Specific Question:   Supervising Provider    Answer:   Tresa Garter [[id number]]    Follow-up: Return in about 2 weeks (around 05/21/2017) for BP check with [place].   Alfonse Spruce FNP

## 2017-05-08 LAB — BASIC METABOLIC PANEL
BUN/Creatinine Ratio: 13 (ref 9–23)
BUN: 14 mg/dL (ref 6–24)
CALCIUM: 9.4 mg/dL (ref 8.7–10.2)
CHLORIDE: 102 mmol/L (ref 96–106)
CO2: 23 mmol/L (ref 18–29)
CREATININE: 1.06 mg/dL — AB (ref 0.57–1.00)
GFR calc Af Amer: 72 mL/min/{1.73_m2} (ref >59)
GFR calc non Af Amer: 63 mL/min/{1.73_m2} (ref >59)
GLUCOSE: 86 mg/dL (ref 65–99)
Potassium: 4.3 mmol/L (ref 3.5–5.2)
Sodium: 139 mmol/L (ref 134–144)

## 2017-05-08 LAB — CBC WITH DIFFERENTIAL/PLATELET
BASOS ABS: 0 10*3/uL (ref 0.0–0.2)
Basos: 1 %
EOS (ABSOLUTE): 0.1 10*3/uL (ref 0.0–0.4)
EOS: 4 %
Hematocrit: 36.2 % (ref 34.0–46.6)
Hemoglobin: 11.5 g/dL (ref 11.1–15.9)
IMMATURE GRANULOCYTES: 0 %
Immature Grans (Abs): 0 10*3/uL (ref 0.0–0.1)
LYMPHS ABS: 0.7 10*3/uL (ref 0.7–3.1)
Lymphs: 25 %
MCH: 23.7 pg — AB (ref 26.6–33.0)
MCHC: 31.8 g/dL (ref 31.5–35.7)
MCV: 75 fL — ABNORMAL LOW (ref 79–97)
MONOS ABS: 0.8 10*3/uL (ref 0.1–0.9)
Monocytes: 26 %
NEUTROS PCT: 44 %
Neutrophils Absolute: 1.3 10*3/uL — ABNORMAL LOW (ref 1.4–7.0)
Platelets: 234 10*3/uL (ref 150–379)
RBC: 4.85 x10E6/uL (ref 3.77–5.28)
RDW: 14.8 % (ref 12.3–15.4)
WBC: 3 10*3/uL — AB (ref 3.4–10.8)

## 2017-05-08 LAB — LIPID PANEL
CHOLESTEROL TOTAL: 196 mg/dL (ref 100–199)
Chol/HDL Ratio: 4.8 ratio — ABNORMAL HIGH (ref 0.0–4.4)
HDL: 41 mg/dL (ref >39)
LDL Calculated: 125 mg/dL — ABNORMAL HIGH (ref 0–99)
TRIGLYCERIDES: 150 mg/dL — AB (ref 0–149)
VLDL Cholesterol Cal: 30 mg/dL (ref 5–40)

## 2017-05-18 ENCOUNTER — Telehealth: Payer: Self-pay | Admitting: *Deleted

## 2017-05-18 NOTE — Telephone Encounter (Signed)
-----   Message from Alfonse Spruce, [place] sent at 05/18/2017  8:45 AM EDT ----- Liver function normal  Kidney function normal  Labs that evaluated your blood cells, fluid and electrolyte balance are normal. No signs of anemia present. White blood cell count is slightly decreased. If you start experiencing fevers or signs of illness schedule follow up.  Lipid levels were elevated. This can increase your risk of heart disease overtime. Start eating a diet low in saturated fat. Limit your intake of fried foods, red meats, and whole milk. Increase physical activity. Recommend recheck in 1 year.

## 2017-05-18 NOTE — Telephone Encounter (Signed)
MA unable to leave a voice message or contact patient due to number ringing continuously with no voicemail option.

## 2017-05-22 ENCOUNTER — Telehealth (INDEPENDENT_AMBULATORY_CARE_PROVIDER_SITE_OTHER): Payer: Self-pay | Admitting: *Deleted

## 2017-05-22 NOTE — Telephone Encounter (Signed)
MA unable to leave another voice message due to phone ringing continuously with no other option. !!!Communication letter has been mailed out!!!

## 2017-05-22 NOTE — Telephone Encounter (Signed)
-----   Message from Alfonse Spruce, [place] sent at 05/18/2017  8:45 AM EDT ----- Liver function normal  Kidney function normal  Labs that evaluated your blood cells, fluid and electrolyte balance are normal. No signs of anemia present. White blood cell count is slightly decreased. If you start experiencing fevers or signs of illness schedule follow up.  Lipid levels were elevated. This can increase your risk of heart disease overtime. Start eating a diet low in saturated fat. Limit your intake of fried foods, red meats, and whole milk. Increase physical activity. Recommend recheck in 1 year.

## 2017-05-23 ENCOUNTER — Telehealth: Payer: Self-pay

## 2017-05-23 NOTE — Telephone Encounter (Signed)
CMA call regarding lab results   Patient Verify DOB   Patient was aware and understood  

## 2017-05-23 NOTE — Telephone Encounter (Signed)
-----   Message from Alfonse Spruce, [place] sent at 05/18/2017  8:45 AM EDT ----- Liver function normal  Kidney function normal  Labs that evaluated your blood cells, fluid and electrolyte balance are normal. No signs of anemia present. White blood cell count is slightly decreased. If you start experiencing fevers or signs of illness schedule follow up.  Lipid levels were elevated. This can increase your risk of heart disease overtime. Start eating a diet low in saturated fat. Limit your intake of fried foods, red meats, and whole milk. Increase physical activity. Recommend recheck in 1 year.

## 2017-07-20 ENCOUNTER — Encounter: Payer: Self-pay | Admitting: Family Medicine

## 2017-07-20 ENCOUNTER — Ambulatory Visit: Payer: Medicare Other | Attending: Family Medicine | Admitting: Family Medicine

## 2017-07-20 VITALS — BP 128/92 | HR 105 | Temp 97.7°F | Resp 18 | Ht 62.0 in | Wt 208.6 lb

## 2017-07-20 DIAGNOSIS — B372 Candidiasis of skin and nail: Secondary | ICD-10-CM | POA: Insufficient documentation

## 2017-07-20 DIAGNOSIS — E669 Obesity, unspecified: Secondary | ICD-10-CM | POA: Insufficient documentation

## 2017-07-20 DIAGNOSIS — Z79899 Other long term (current) drug therapy: Secondary | ICD-10-CM | POA: Insufficient documentation

## 2017-07-20 DIAGNOSIS — F5101 Primary insomnia: Secondary | ICD-10-CM | POA: Insufficient documentation

## 2017-07-20 DIAGNOSIS — R21 Rash and other nonspecific skin eruption: Secondary | ICD-10-CM | POA: Diagnosis not present

## 2017-07-20 DIAGNOSIS — G4733 Obstructive sleep apnea (adult) (pediatric): Secondary | ICD-10-CM | POA: Diagnosis not present

## 2017-07-20 DIAGNOSIS — I10 Essential (primary) hypertension: Secondary | ICD-10-CM | POA: Insufficient documentation

## 2017-07-20 DIAGNOSIS — H409 Unspecified glaucoma: Secondary | ICD-10-CM | POA: Diagnosis not present

## 2017-07-20 DIAGNOSIS — D869 Sarcoidosis, unspecified: Secondary | ICD-10-CM | POA: Insufficient documentation

## 2017-07-20 DIAGNOSIS — H269 Unspecified cataract: Secondary | ICD-10-CM | POA: Diagnosis not present

## 2017-07-20 MED ORDER — VALSARTAN-HYDROCHLOROTHIAZIDE 160-25 MG PO TABS: 1.0000 | ORAL_TABLET | Freq: Every day | ORAL | 0 refills | Status: DC

## 2017-07-20 MED ORDER — AMLODIPINE BESYLATE 5 MG PO TABS: 5.0000 mg | ORAL_TABLET | Freq: Every day | ORAL | 0 refills | Status: DC

## 2017-07-20 MED ORDER — KETOCONAZOLE 2 % EX CREA: TOPICAL_CREAM | CUTANEOUS | 1 refills | Status: DC

## 2017-07-20 MED ORDER — TRAZODONE HCL 50 MG PO TABS: 50.0000 mg | ORAL_TABLET | Freq: Every evening | ORAL | 1 refills | Status: DC | PRN

## 2017-07-20 NOTE — Progress Notes (Signed)
Patient is here for HTN  

## 2017-07-20 NOTE — Patient Instructions (Signed)
Skin Yeast Infection Skin yeast infection is a condition in which there is an overgrowth of yeast (candida) that normally lives on the skin. This condition usually occurs in areas of the skin that are constantly warm and moist, such as the armpits or the groin. What are the causes? This condition is caused by a change in the normal balance of the yeast and bacteria that live on the skin. What increases the risk? This condition is more likely to develop in:  People who are obese.  Pregnant women.  Women who take birth control pills.  People who have diabetes.  People who take antibiotic medicines.  People who take steroid medicines.  People who are malnourished.  People who have a weak defense (immune) system.  People who are 65 years of age or older.  What are the signs or symptoms? Symptoms of this condition include:  A red, swollen area of the skin.  Bumps on the skin.  Itchiness.  How is this diagnosed? This condition is diagnosed with a medical history and physical exam. Your health care provider may check for yeast by taking light scrapings of the skin to be viewed under a microscope. How is this treated? This condition is treated with medicine. Medicines may be prescribed or be available over-the-counter. The medicines may be:  Taken by mouth (orally).  Applied as a cream.  Follow these instructions at home:  Take or apply over-the-counter and prescription medicines only as told by your health care provider.  Eat more yogurt. This may help to keep your yeast infection from returning.  Maintain a healthy weight. If you need help losing weight, talk with your health care provider.  Keep your skin clean and dry.  If you have diabetes, keep your blood sugar under control. Contact a health care provider if:  Your symptoms go away and then return.  Your symptoms do not get better with treatment.  Your symptoms get worse.  Your rash spreads.  You have a  fever or chills.  You have new symptoms.  You have new warmth or redness of your skin. This information is not intended to replace advice given to you by your health care provider. Make sure you discuss any questions you have with your health care provider. Document Released: 08/29/2011 Document Revised: 08/06/2016 Document Reviewed: 06/14/2015 Elsevier Interactive Patient Education  2018 [employer]  

## 2017-07-20 NOTE — Progress Notes (Signed)
Subjective:  Patient ID: Marie Wright, female    DOB: [birth year]  Age: 46 y.o. MRN: [id number]  CC: Hypertension   HPI Marie Wright presents for history of hypertension. She is exercising and is adherent to low salt diet. Report exercising 3 times a week , walking for 10 minutes per day.  History of sarcoidosis History of OSA. Reports diagnosis 2 years ago. She is compliant with CPAP use. Reports only 3 to 4 hours of sleep per night. Denies any depression/anxiety, SI/HI. Denies taking anything for symptoms. She does check BP.  BP range in the 160's SBP and 90's-102 DBP. She reports being without her medications for several days.Cardiac symptoms lower extremity edema. 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 She is followed by  ophthalmologist. Patient denies chest pain, chest pressure/discomfort, claudication, dyspnea, palpitations and syncope. Cardiovascular risk factors: hypertension and obesity (BMI >= 30 kg/m2). Use of agents associated with hypertension: steroids. History of target organ damage: none. Rash: 3 week history. Location underneath bilrateral  Breasts. Symptoms include redness, peeling, itching, and irritation. Use powders for symptoms, no medication use.     Outpatient Medications Prior to Visit  Medication Sig Dispense Refill  . albuterol (PROVENTIL HFA;VENTOLIN HFA) 108 (90 Base) MCG/ACT inhaler Inhale 2 puffs into the lungs every 6 (six) hours as needed for wheezing or shortness of breath. 1 Inhaler 2  . brimonidine (ALPHAGAN) 0.2 % ophthalmic solution 3 (three) times daily.    . budesonide-formoterol (SYMBICORT) 160-4.5 MCG/ACT inhaler Inhale 2 puffs into the lungs 2 (two) times daily.    . dorzolamide (TRUSOPT) 2 % ophthalmic solution 1 drop 3 (three) times daily.    . prednisoLONE acetate (PRED FORTE) 1 % ophthalmic suspension 1 drop 4 (four) times daily.    . predniSONE  (DELTASONE) 20 MG tablet Take 20 mg by mouth daily with breakfast.    . timolol (TIMOPTIC) 0.5 % ophthalmic solution 1 drop 2 (two) times daily.    . traZODone (DESYREL) 50 MG tablet TAKE HALF TO 1 TABLET BY MOUTH AT BEDTIME AS NEEDED FOR SLEEP 30 tablet 0  . valsartan-hydrochlorothiazide (DIOVAN HCT) 160-25 MG tablet Take 1 tablet by mouth daily. 90 tablet 0   No facility-administered medications prior to visit.     ROS Review of Systems  Constitutional: Negative.   Eyes: Negative.   Respiratory: Negative.   Cardiovascular: Negative.   Gastrointestinal: Negative.   Skin: Positive for rash.  Neurological: Negative.   Psychiatric/Behavioral: Positive for sleep disturbance.   Objective:  BP (!) 128/92 (BP Location: Left Arm, Patient Position: Sitting, Cuff Size: Normal)   Pulse (!) 105   Temp 97.7 F (36.5 C) (Oral)   Resp 18   Ht 5\' 2"  (1.575 m)   Wt 208 lb 9.6 oz (94.6 kg)   SpO2 96%   BMI 38.15 kg/m   BP/Weight 07/20/2017 05/07/2017 7/42/5956  Systolic BP 387 564 332  Diastolic BP 92 90 80  Wt. (Lbs) 208.6 207.6 215  BMI 38.15 37.97 39.32     Physical Exam  Constitutional: She is oriented to person, place, and time. She appears well-developed and well-nourished.  Eyes: Pupils are equal, round, and reactive to light. Conjunctivae are normal.  Neck: No JVD present.  Cardiovascular: Normal rate, regular rhythm, normal heart sounds and intact distal pulses.   Pulmonary/Chest: Effort normal and breath sounds normal.  Abdominal: Soft. Bowel sounds are normal. There is no tenderness.  Neurological: She is alert and oriented to person, place, and time.  Skin: Skin is warm and dry. Rash noted.  Psychiatric: She has a normal mood and affect. She expresses no homicidal and no suicidal ideation. She expresses no suicidal plans and no homicidal plans.  Nursing note and vitals reviewed.    Assessment & Plan:   Problem List Items Addressed This Visit      Cardiovascular and  Mediastinum   Essential hypertension - Primary   Relevant Medications   valsartan-hydrochlorothiazide (DIOVAN HCT) 160-25 MG tablet   amLODipine (NORVASC) 5 MG tablet    Other Visit Diagnoses    Candidiasis, intertriginous       Relevant Medications   ketoconazole (NIZORAL) 2 % cream   Primary insomnia       Relevant Medications   traZODone (DESYREL) 50 MG tablet      Meds ordered this encounter  Medications  . valsartan-hydrochlorothiazide (DIOVAN HCT) 160-25 MG tablet    Sig: Take 1 tablet by mouth daily.    Dispense:  90 tablet    Refill:  0    Order Specific Question:   Supervising Provider    Answer:   Tresa Garter [id number]  . amLODipine (NORVASC) 5 MG tablet    Sig: Take 1 tablet (5 mg total) by mouth daily.    Dispense:  90 tablet    Refill:  0    Order Specific Question:   Supervising Provider    Answer:   Tresa Garter [id number]  . ketoconazole (NIZORAL) 2 % cream    Sig: APPLY ONE APPLICATION TO AFFECTED AREAS TOPICALLY DAILY FOR 3 WEEKS.    Dispense:  30 g    Refill:  1    Order Specific Question:   Supervising Provider    Answer:   Tresa Garter [id number]  . traZODone (DESYREL) 50 MG tablet    Sig: Take 1-2 tablets (50-100 mg total) by mouth at bedtime as needed for sleep.    Dispense:  60 tablet    Refill:  1    Order Specific Question:   Supervising Provider    Answer:   Tresa Garter [id number]    Follow-up: Return in about 2 weeks (around 08/03/2017) for PAP & BP .   Alfonse Spruce FNP

## 2017-08-03 ENCOUNTER — Encounter: Payer: Self-pay | Admitting: Family Medicine

## 2017-08-03 ENCOUNTER — Ambulatory Visit: Payer: Medicare Other | Attending: Family Medicine | Admitting: Family Medicine

## 2017-08-03 ENCOUNTER — Other Ambulatory Visit (HOSPITAL_COMMUNITY)
Admission: RE | Admit: 2017-08-03 | Discharge: 2017-08-03 | Disposition: A | Discharge status: Home / Self Care | Payer: Medicare Other | Source: Ambulatory Visit | Attending: Family Medicine | Admitting: Family Medicine

## 2017-08-03 VITALS — BP 129/85 | HR 95 | Temp 97.5°F | Resp 18 | Ht 62.0 in | Wt 210.4 lb

## 2017-08-03 DIAGNOSIS — Z01419 Encounter for gynecological examination (general) (routine) without abnormal findings: Secondary | ICD-10-CM | POA: Diagnosis not present

## 2017-08-03 DIAGNOSIS — Z30011 Encounter for initial prescription of contraceptive pills: Secondary | ICD-10-CM

## 2017-08-03 DIAGNOSIS — Z79899 Other long term (current) drug therapy: Secondary | ICD-10-CM | POA: Diagnosis not present

## 2017-08-03 DIAGNOSIS — Z803 Family history of malignant neoplasm of breast: Secondary | ICD-10-CM | POA: Insufficient documentation

## 2017-08-03 DIAGNOSIS — Z8742 Personal history of other diseases of the female genital tract: Secondary | ICD-10-CM | POA: Diagnosis not present

## 2017-08-03 DIAGNOSIS — B373 Candidiasis of vulva and vagina: Secondary | ICD-10-CM | POA: Diagnosis not present

## 2017-08-03 DIAGNOSIS — N920 Excessive and frequent menstruation with regular cycle: Secondary | ICD-10-CM | POA: Insufficient documentation

## 2017-08-03 DIAGNOSIS — Z113 Encounter for screening for infections with a predominantly sexual mode of transmission: Secondary | ICD-10-CM | POA: Diagnosis not present

## 2017-08-03 DIAGNOSIS — N898 Other specified noninflammatory disorders of vagina: Secondary | ICD-10-CM | POA: Diagnosis not present

## 2017-08-03 DIAGNOSIS — N924 Excessive bleeding in the premenopausal period: Secondary | ICD-10-CM

## 2017-08-03 DIAGNOSIS — R1084 Generalized abdominal pain: Secondary | ICD-10-CM | POA: Diagnosis not present

## 2017-08-03 LAB — POCT URINALYSIS DIPSTICK
Bilirubin, UA: NEGATIVE
GLUCOSE UA: NEGATIVE
Ketones, UA: NEGATIVE
LEUKOCYTES UA: NEGATIVE
NITRITE UA: NEGATIVE
PH UA: 6 (ref 5.0–8.0)
Protein, UA: NEGATIVE
Spec Grav, UA: 1.02 (ref 1.010–1.025)
UROBILINOGEN UA: 0.2 U/dL

## 2017-08-03 LAB — POCT URINE PREGNANCY: PREG TEST UR: NEGATIVE

## 2017-08-03 MED ORDER — ACETAMINOPHEN 500 MG PO TABS: 1000.0000 mg | ORAL_TABLET | Freq: Three times a day (TID) | ORAL | 0 refills | Status: AC | PRN

## 2017-08-03 MED ORDER — NORETHIN ACE-ETH ESTRAD-FE 1-20 MG-MCG PO TABS: 1.0000 | ORAL_TABLET | Freq: Every day | ORAL | 11 refills | Status: DC

## 2017-08-03 MED ORDER — ACETAMINOPHEN 500 MG PO TABS: 1000.0000 mg | ORAL_TABLET | Freq: Three times a day (TID) | ORAL | 0 refills | Status: DC | PRN

## 2017-08-03 NOTE — Progress Notes (Signed)
Subjective:  Patient ID: Marie Wright, female    DOB: 10-Mar-1971  Age: 46 y.o. MRN: [id number]  CC: Gynecologic Exam   HPI Marie Wright presents for well woman visit with gynecological examination. She does report family history of breast cancer- paternal aunt. She denies any family history of gynecological cancers. She denies any lumps, nipple discharge, denting or dimpling of the breast. She does not perform monthly SBE. She is a non-smoker. She does report brownish vaginal discharge for two months. She denies any vaginal lesionsor dysuria.She would likeSTI testing. Reports no sexual partner within the last 3 months. She also reports history of heavy menstrual periods. She reports symptoms for several years. She reports history of ovarian cyst. She is agreeable to OC at this time. She denies any history of bleeding or clotting disorders, smoking, or diagnosed chronic migraine headaches.     Outpatient Medications Prior to Visit  Medication Sig Dispense Refill  . albuterol (PROVENTIL HFA;VENTOLIN HFA) 108 (90 Base) MCG/ACT inhaler Inhale 2 puffs into the lungs every 6 (six) hours as needed for wheezing or shortness of breath. 1 Inhaler 2  . amLODipine (NORVASC) 5 MG tablet Take 1 tablet (5 mg total) by mouth daily. 90 tablet 0  . brimonidine (ALPHAGAN) 0.2 % ophthalmic solution 3 (three) times daily.    . budesonide-formoterol (SYMBICORT) 160-4.5 MCG/ACT inhaler Inhale 2 puffs into the lungs 2 (two) times daily.    . dorzolamide (TRUSOPT) 2 % ophthalmic solution 1 drop 3 (three) times daily.    Marland Kitchen ketoconazole (NIZORAL) 2 % cream APPLY ONE APPLICATION TO AFFECTED AREAS TOPICALLY DAILY FOR 3 WEEKS. 30 g 1  . prednisoLONE acetate (PRED FORTE) 1 % ophthalmic suspension 1 drop 4 (four) times daily.    . predniSONE (DELTASONE) 20 MG tablet Take 20 mg by mouth daily with breakfast.    . timolol (TIMOPTIC) 0.5 % ophthalmic solution 1 drop 2 (two) times daily.    . traZODone (DESYREL) 50 MG  tablet Take 1-2 tablets (50-100 mg total) by mouth at bedtime as needed for sleep. 60 tablet 1  . valsartan-hydrochlorothiazide (DIOVAN HCT) 160-25 MG tablet Take 1 tablet by mouth daily. 90 tablet 0   No facility-administered medications prior to visit.     ROS Review of Systems  Constitutional: Negative.   Respiratory: Negative.   Cardiovascular: Negative.   Gastrointestinal: Negative.   Genitourinary: Positive for vaginal discharge.  Musculoskeletal: Negative.   Skin: Negative.   Psychiatric/Behavioral: Negative.    Objective:  BP 129/85 (BP Location: Left Arm, Patient Position: Sitting, Cuff Size: Normal)   Pulse 95   Temp (!) 97.5 F (36.4 C) (Oral)   Resp 18   Ht 5\' 2"  (1.575 m)   Wt 210 lb 6.4 oz (95.4 kg)   SpO2 98%   BMI 38.48 kg/m   BP/Weight 08/03/2017 07/20/2017 8/67/6195  Systolic BP 093 267 124  Diastolic BP 85 92 90  Wt. (Lbs) 210.4 208.6 207.6  BMI 38.48 38.15 37.97     Physical Exam  Constitutional: She appears well-developed and well-nourished.  Eyes: Pupils are equal, round, and reactive to light. Conjunctivae are normal.  Cardiovascular: Normal rate, regular rhythm, normal heart sounds and intact distal pulses.   Pulmonary/Chest: Effort normal and breath sounds normal.  Abdominal: Soft. Bowel sounds are normal. There is tenderness.  Genitourinary: Cervix exhibits discharge (scant). No vaginal discharge found.  Skin: Skin is warm and dry.  Psychiatric: She has a normal mood and affect.  Nursing note and vitals  reviewed.  Assessment & Plan:   Problem List Items Addressed This Visit    None    Visit Diagnoses    Well woman exam with routine gynecological exam    -  Primary   Relevant Orders   Cytology - PAP [place] (Completed)   Cervicovaginal ancillary only (Completed)   Cervicovaginal ancillary only (Completed)   Screening for STDs (sexually transmitted diseases)       Relevant Orders   Cytology - PAP [place] (Completed)   HEP,  RPR, HIV Panel   HSV(herpes simplex vrs) 1+2 ab-IgG   Cervicovaginal ancillary only (Completed)   Cervicovaginal ancillary only (Completed)   Generalized abdominal pain       Relevant Medications   acetaminophen (TYLENOL) 500 MG tablet   Other Relevant Orders   Urinalysis Dipstick (Completed)   CBC with Differential   Comprehensive metabolic panel   Vaginal discharge       Relevant Orders   Cervicovaginal ancillary only (Completed)   Cervicovaginal ancillary only (Completed)   Perimenopausal menorrhagia       Relevant Medications   norethindrone-ethinyl estradiol (JUNEL FE 1/20) 1-20 MG-MCG tablet   Other Relevant Orders   US Pelvis Complete   US Transvaginal Non-OB   History of ovarian cyst       Oral contraception initiation       Relevant Medications   norethindrone-ethinyl estradiol (JUNEL FE 1/20) 1-20 MG-MCG tablet   Other Relevant Orders   POCT urine pregnancy (Completed)      Meds ordered this encounter  Medications  . DISCONTD: norethindrone-ethinyl estradiol (JUNEL FE 1/20) 1-20 MG-MCG tablet    Sig: Take 1 tablet by mouth daily.    Dispense:  1 Package    Refill:  11    Order Specific Question:   Supervising Provider    Answer:   Tresa Garter [id number]  . DISCONTD: acetaminophen (TYLENOL) 500 MG tablet    Sig: Take 2 tablets (1,000 mg total) by mouth every 8 (eight) hours as needed for moderate pain.    Dispense:  30 tablet    Refill:  0    Order Specific Question:   Supervising Provider    Answer:   Tresa Garter [id number]  . acetaminophen (TYLENOL) 500 MG tablet    Sig: Take 2 tablets (1,000 mg total) by mouth every 8 (eight) hours as needed for moderate pain.    Dispense:  30 tablet    Refill:  0  . norethindrone-ethinyl estradiol (JUNEL FE 1/20) 1-20 MG-MCG tablet    Sig: Take 1 tablet by mouth daily.    Dispense:  1 Package    Refill:  11    Follow-up: Return in about 3 months (around 11/03/2017), or if symptoms worsen or fail to  improve, for HTN/ OC.   Alfonse Spruce FNP

## 2017-08-03 NOTE — Patient Instructions (Addendum)
Oral Contraception Use Oral contraceptive pills (OCPs) are medicines taken to prevent pregnancy. OCPs work by preventing the ovaries from releasing eggs. The hormones in OCPs also cause the cervical mucus to thicken, preventing the sperm from entering the uterus. The hormones also cause the uterine lining to become thin, not allowing a fertilized egg to attach to the inside of the uterus. OCPs are highly effective when taken exactly as prescribed. However, OCPs do not prevent sexually transmitted diseases (STDs). Safe sex practices, such as using condoms along with an OCP, can help prevent STDs. Before taking OCPs, you may have a physical exam and Pap test. Your health care provider may also order blood tests if necessary. Your health care provider will make sure you are a good candidate for oral contraception. Discuss with your health care provider the possible side effects of the OCP you may be prescribed. When starting an OCP, it can take 2 to 3 months for the body to adjust to the changes in hormone levels in your body. How to take oral contraceptive pills Your health care provider may advise you on how to start taking the first cycle of OCPs. Otherwise, you can:  Start on day 1 of your menstrual period. You will not need any backup contraceptive protection with this start time.  Start on the first Sunday after your menstrual period or the day you get your prescription. In these cases, you will need to use backup contraceptive protection for the first week.  Start the pill at any time of your cycle. If you take the pill within 5 days of the start of your period, you are protected against pregnancy right away. In this case, you will not need a backup form of birth control. If you start at any other time of your menstrual cycle, you will need to use another form of birth control for 7 days. If your OCP is the type called a minipill, it will protect you from pregnancy after taking it for 2 days (48  hours).  After you have started taking OCPs:  If you forget to take 1 pill, take it as soon as you remember. Take the next pill at the regular time.  If you miss 2 or more pills, call your health care provider because different pills have different instructions for missed doses. Use backup birth control until your next menstrual period starts.  If you use a 28-day pack that contains inactive pills and you miss 1 of the last 7 pills (pills with no hormones), it will not matter. Throw away the rest of the non-hormone pills and start a new pill pack.  No matter which day you start the OCP, you will always start a new pack on that same day of the week. Have an extra pack of OCPs and a backup contraceptive method available in case you miss some pills or lose your OCP pack. Follow these instructions at home:  Do not smoke.  Always use a condom to protect against STDs. OCPs do not protect against STDs.  Use a calendar to mark your menstrual period days.  Read the information and directions that came with your OCP. Talk to your health care provider if you have questions. Contact a health care provider if:  You develop nausea and vomiting.  You have abnormal vaginal discharge or bleeding.  You develop a rash.  You miss your menstrual period.  You are losing your hair.  You need treatment for mood swings or depression.  You   get dizzy when taking the OCP.  You develop acne from taking the OCP.  You become pregnant. Get help right away if:  You develop chest pain.  You develop shortness of breath.  You have an uncontrolled or severe headache.  You develop numbness or slurred speech.  You develop visual problems.  You develop pain, redness, and swelling in the legs. This information is not intended to replace advice given to you by your health care provider. Make sure you discuss any questions you have with your health care provider. Document Released: 11/30/2011 Document  Revised: 05/18/2016 Document Reviewed: 06/01/2013 Elsevier Interactive Patient Education  2017 Elsevier Inc.     Breast Self-Awareness Breast self-awareness means:  Knowing how your breasts look.  Knowing how your breasts feel.  Checking your breasts every month for changes.  Telling your doctor if you notice a change in your breasts.  Breast self-awareness allows you to notice a breast problem early while it is still small. How to do a breast self-exam One way to learn what is normal for your breasts and to check for changes is to do a breast self-exam. To do a breast self-exam: Look for Changes  1. Take off all the clothes above your waist. 2. Stand in front of a mirror in a room with good lighting. 3. Put your hands on your hips. 4. Push your hands down. 5. Look at your breasts and nipples in the mirror to see if one breast or nipple looks different than the other. Check to see if: ? The shape of one breast is different. ? The size of one breast is different. ? There are wrinkles, dips, and bumps in one breast and not the other. 6. Look at each breast for changes in your skin, such as: ? Redness. ? Scaly areas. 7. Look for changes in your nipples, such as: ? Liquid around the nipples. ? Bleeding. ? Dimpling. ? Redness. ? A change in where the nipples are. Feel for Changes 1. Lie on your back on the floor. 2. Feel each breast. To do this, follow these steps: ? Pick a breast to feel. ? Put the arm closest to that breast above your head. ? Use your other arm to feel the nipple area of your breast. Feel the area with the pads of your three middle fingers by making small circles with your fingers. For the first circle, press lightly. For the second circle, press harder. For the third circle, press even harder. ? Keep making circles with your fingers at the light, harder, and even harder pressures as you move down your breast. Stop when you feel your ribs. ? Move your  fingers a little toward the center of your body. ? Start making circles with your fingers again, this time going up until you reach your collarbone. ? Keep making up and down circles until you reach your armpit. Remember to keep using the three pressures. ? Feel the other breast in the same way. 3. Sit or stand in the shower or tub. 4. With soapy water on your skin, feel each breast the same way you did in step 2, when you were lying on the floor. Write Down What You Find  After doing the self-exam, write down:  What is normal for each breast.  Any changes you find in each breast.  When you last had your period.  How often should I check my breasts? Check your breasts every month. If you are breastfeeding, the best time   to check them is after you feed your baby or after you use a breast pump. If you get periods, the best time to check your breasts is 5-7 days after your period is over. When should I see my doctor? See your doctor if you notice:  A change in shape or size of your breasts or nipples.  A change in the skin of your breast or nipples, such as red or scaly skin.  Unusual fluid coming from your nipples.  A lump or thick area that was not there before.  Pain in your breasts.  Anything that concerns you.  This information is not intended to replace advice given to you by your health care provider. Make sure you discuss any questions you have with your health care provider. Document Released: 05/29/2008 Document Revised: 05/18/2016 Document Reviewed: 10/31/2015 Elsevier Interactive Patient Education  2018 Elsevier Inc.   Pap Test Why am I having this test? A pap test is sometimes called a pap smear. It is a screening test that is used to check for signs of cancer of the vagina, cervix, and uterus. The test can also identify the presence of infection or precancerous changes. Your health care provider will likely recommend you have this test done on a regular basis. This  test may be done:  Every 3 years, starting at age 21.  Every 5 years, in combination with testing for the presence of human papillomavirus (HPV).  More or less often depending on other medical conditions.  What kind of sample is taken? Using a small cotton swab, plastic spatula, or brush, your health care provider will collect a sample of cells from the surface of your cervix. Your cervix is the opening to your uterus, also called a womb. Secretions from the cervix and vagina may also be collected. How do I prepare for this test?  Be aware of where you are in your menstrual cycle. You may be asked to reschedule the test if you are menstruating on the day of the test.  You may need to reschedule if you have a known vaginal infection on the day of the test.  You may be asked to avoid douching or taking a bath the day before or the day of the test.  Some medicines can cause abnormal test results, such as digitalis and tetracycline. Talk with your health care provider before your test if you take one of these medicines. What do the results mean? Abnormal test results may indicate a number of health conditions. These may include:  Cancer. Although pap test results cannot be used to diagnose cancer of the cervix, vagina, or uterus, they may suggest the possibility of cancer. Further tests would be required to determine if cancer is present.  Sexually transmitted disease.  Fungal infection.  Parasite infection.  Herpes infection.  A condition causing or contributing to infertility.  It is your responsibility to obtain your test results. Ask the lab or department performing the test when and how you will get your results. Contact your health care provider to discuss any questions you have about your results. Talk with your health care provider to discuss your results, treatment options, and if necessary, the need for more tests. Talk with your health care provider if you have any questions  about your results. This information is not intended to replace advice given to you by your health care provider. Make sure you discuss any questions you have with your health care provider. Document Released: 03/03/2003 Document Revised:   Chartered certified accountant Patient Education  [employer].

## 2017-08-03 NOTE — Progress Notes (Signed)
Patient is here for HTN/PAP Patient stated that she been having pelvic pain

## 2017-08-04 LAB — HEP, RPR, HIV PANEL
HIV SCREEN 4TH GENERATION: NONREACTIVE
Hepatitis B Surface Ag: NEGATIVE
RPR: NONREACTIVE

## 2017-08-04 LAB — CBC WITH DIFFERENTIAL/PLATELET
Basophils Absolute: 0 10*3/uL (ref 0.0–0.2)
Basos: 1 %
EOS (ABSOLUTE): 0.1 10*3/uL (ref 0.0–0.4)
Eos: 3 %
HEMATOCRIT: 34.5 % (ref 34.0–46.6)
Hemoglobin: 11.3 g/dL (ref 11.1–15.9)
IMMATURE GRANS (ABS): 0 10*3/uL (ref 0.0–0.1)
IMMATURE GRANULOCYTES: 0 %
Lymphocytes Absolute: 0.7 10*3/uL (ref 0.7–3.1)
Lymphs: 27 %
MCH: 24.5 pg — AB (ref 26.6–33.0)
MCHC: 32.8 g/dL (ref 31.5–35.7)
MCV: 75 fL — AB (ref 79–97)
Monocytes Absolute: 0.4 10*3/uL (ref 0.1–0.9)
Monocytes: 16 %
Neutrophils Absolute: 1.3 10*3/uL — ABNORMAL LOW (ref 1.4–7.0)
Neutrophils: 53 %
PLATELETS: 206 10*3/uL (ref 150–379)
RBC: 4.62 x10E6/uL (ref 3.77–5.28)
RDW: 16.4 % — ABNORMAL HIGH (ref 12.3–15.4)
WBC: 2.5 10*3/uL — AB (ref 3.4–10.8)

## 2017-08-04 LAB — COMPREHENSIVE METABOLIC PANEL
A/G RATIO: 1 — AB (ref 1.2–2.2)
ALBUMIN: 4 g/dL (ref 3.5–5.5)
ALT: 8 IU/L (ref 0–32)
AST: 17 IU/L (ref 0–40)
Alkaline Phosphatase: 103 IU/L (ref 39–117)
BUN / CREAT RATIO: 16 (ref 9–23)
BUN: 16 mg/dL (ref 6–24)
CALCIUM: 9.4 mg/dL (ref 8.7–10.2)
CHLORIDE: 105 mmol/L (ref 96–106)
CO2: 22 mmol/L (ref 20–29)
Creatinine, Ser: 1.03 mg/dL — ABNORMAL HIGH (ref 0.57–1.00)
GFR, EST AFRICAN AMERICAN: 75 mL/min/{1.73_m2} (ref >59)
GFR, EST NON AFRICAN AMERICAN: 65 mL/min/{1.73_m2} (ref >59)
GLOBULIN, TOTAL: 3.9 g/dL (ref 1.5–4.5)
Glucose: 95 mg/dL (ref 65–99)
POTASSIUM: 4.6 mmol/L (ref 3.5–5.2)
SODIUM: 140 mmol/L (ref 134–144)
TOTAL PROTEIN: 7.9 g/dL (ref 6.0–8.5)

## 2017-08-04 LAB — HSV-2 IGG SUPPLEMENTAL TEST: HSV-2 IgG Supplemental Test: NEGATIVE

## 2017-08-04 LAB — HSV(HERPES SIMPLEX VRS) I + II AB-IGG
HSV 1 GLYCOPROTEIN G AB, IGG: 38.3 {index} — AB (ref 0.00–0.90)
HSV 2 IGG, TYPE SPEC: 2.34 {index} — AB (ref 0.00–0.90)

## 2017-08-06 LAB — CERVICOVAGINAL ANCILLARY ONLY
Bacterial vaginitis: NEGATIVE
Candida vaginitis: POSITIVE — AB
Chlamydia: NEGATIVE
NEISSERIA GONORRHEA: NEGATIVE
TRICH (WINDOWPATH): NEGATIVE

## 2017-08-07 LAB — CYTOLOGY - PAP
DIAGNOSIS: NEGATIVE
HPV (WINDOPATH): NOT DETECTED

## 2017-08-08 LAB — CERVICOVAGINAL ANCILLARY ONLY: HERPES (WINDOWPATH): NEGATIVE

## 2017-08-10 ENCOUNTER — Ambulatory Visit (HOSPITAL_COMMUNITY): Payer: Medicare Other

## 2017-08-10 ENCOUNTER — Other Ambulatory Visit: Payer: Self-pay | Admitting: Family Medicine

## 2017-08-10 ENCOUNTER — Telehealth: Payer: Self-pay

## 2017-08-10 DIAGNOSIS — B373 Candidiasis of vulva and vagina: Secondary | ICD-10-CM

## 2017-08-10 DIAGNOSIS — B3731 Acute candidiasis of vulva and vagina: Secondary | ICD-10-CM

## 2017-08-10 MED ORDER — FLUCONAZOLE 150 MG PO TABS: 150.0000 mg | ORAL_TABLET | Freq: Once | ORAL | 0 refills | Status: AC

## 2017-08-10 MED ORDER — FLUCONAZOLE 150 MG PO TABS: 150.0000 mg | ORAL_TABLET | Freq: Once | ORAL | 0 refills | Status: DC

## 2017-08-10 NOTE — Telephone Encounter (Signed)
-----   Message from Alfonse Spruce, [place] sent at 08/10/2017  3:58 PM EDT ----- Pap smear showed no lesions or malignancy. Yeast was positive. You will be prescribed diflucan to treat. To reduce your risk of developing yeast infection don't douche, don't use scented soap or sprays, and wear cotton undergarments. Herpes culture, Gonorrhea, Chlamydia, BV, and Trichomonas were all negative.

## 2017-08-10 NOTE — Telephone Encounter (Signed)
CMA call regarding lab results   Patient Verify DOB   Patient was was aware and understood

## 2017-08-17 ENCOUNTER — Ambulatory Visit (HOSPITAL_COMMUNITY)
Admission: RE | Admit: 2017-08-17 | Discharge: 2017-08-17 | Disposition: A | Discharge status: Home / Self Care | Payer: Medicare Other | Source: Ambulatory Visit | Attending: Family Medicine | Admitting: Family Medicine

## 2017-08-17 DIAGNOSIS — D259 Leiomyoma of uterus, unspecified: Secondary | ICD-10-CM | POA: Diagnosis not present

## 2017-08-17 DIAGNOSIS — N924 Excessive bleeding in the premenopausal period: Secondary | ICD-10-CM

## 2017-08-18 IMAGING — DX DG CHEST 2V
2 series · 2 of 2 positions shown · non-contrast
Comparison: None.

CLINICAL DATA: Shortness of breath and cough

EXAM:
CHEST  2 VIEW

[chest pa]
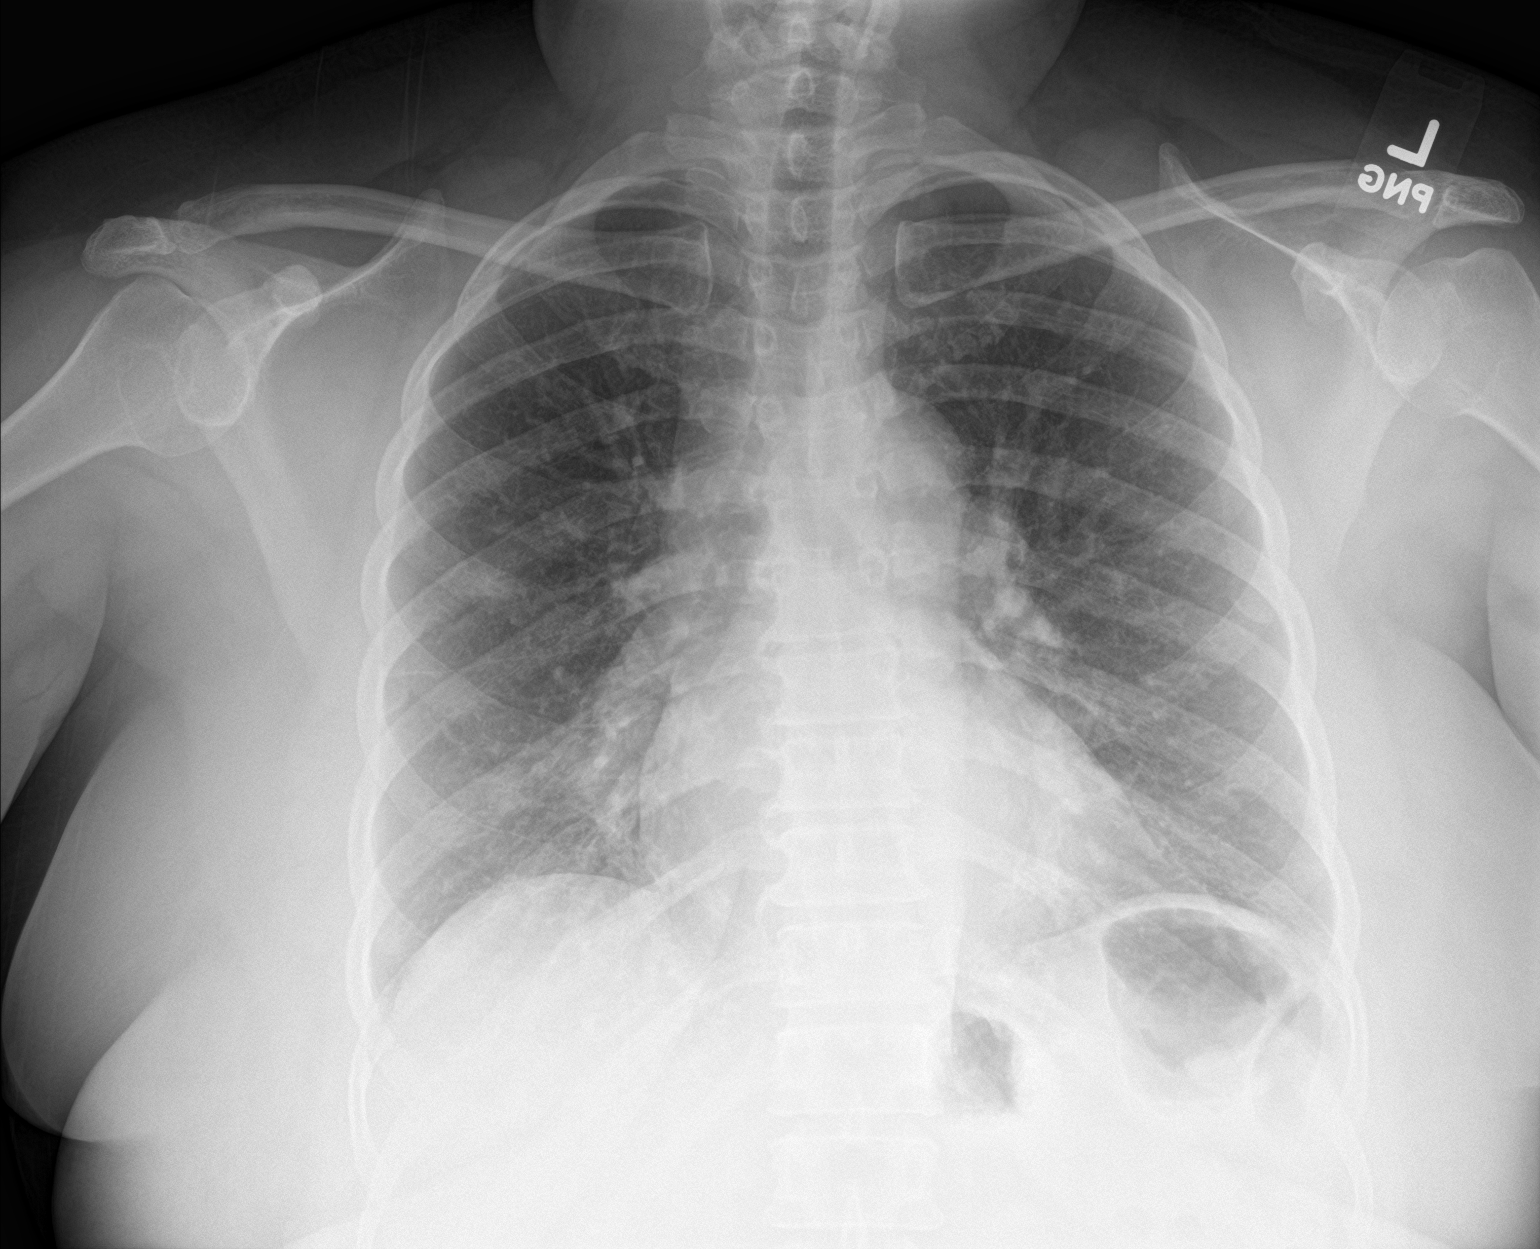

[chest lat]
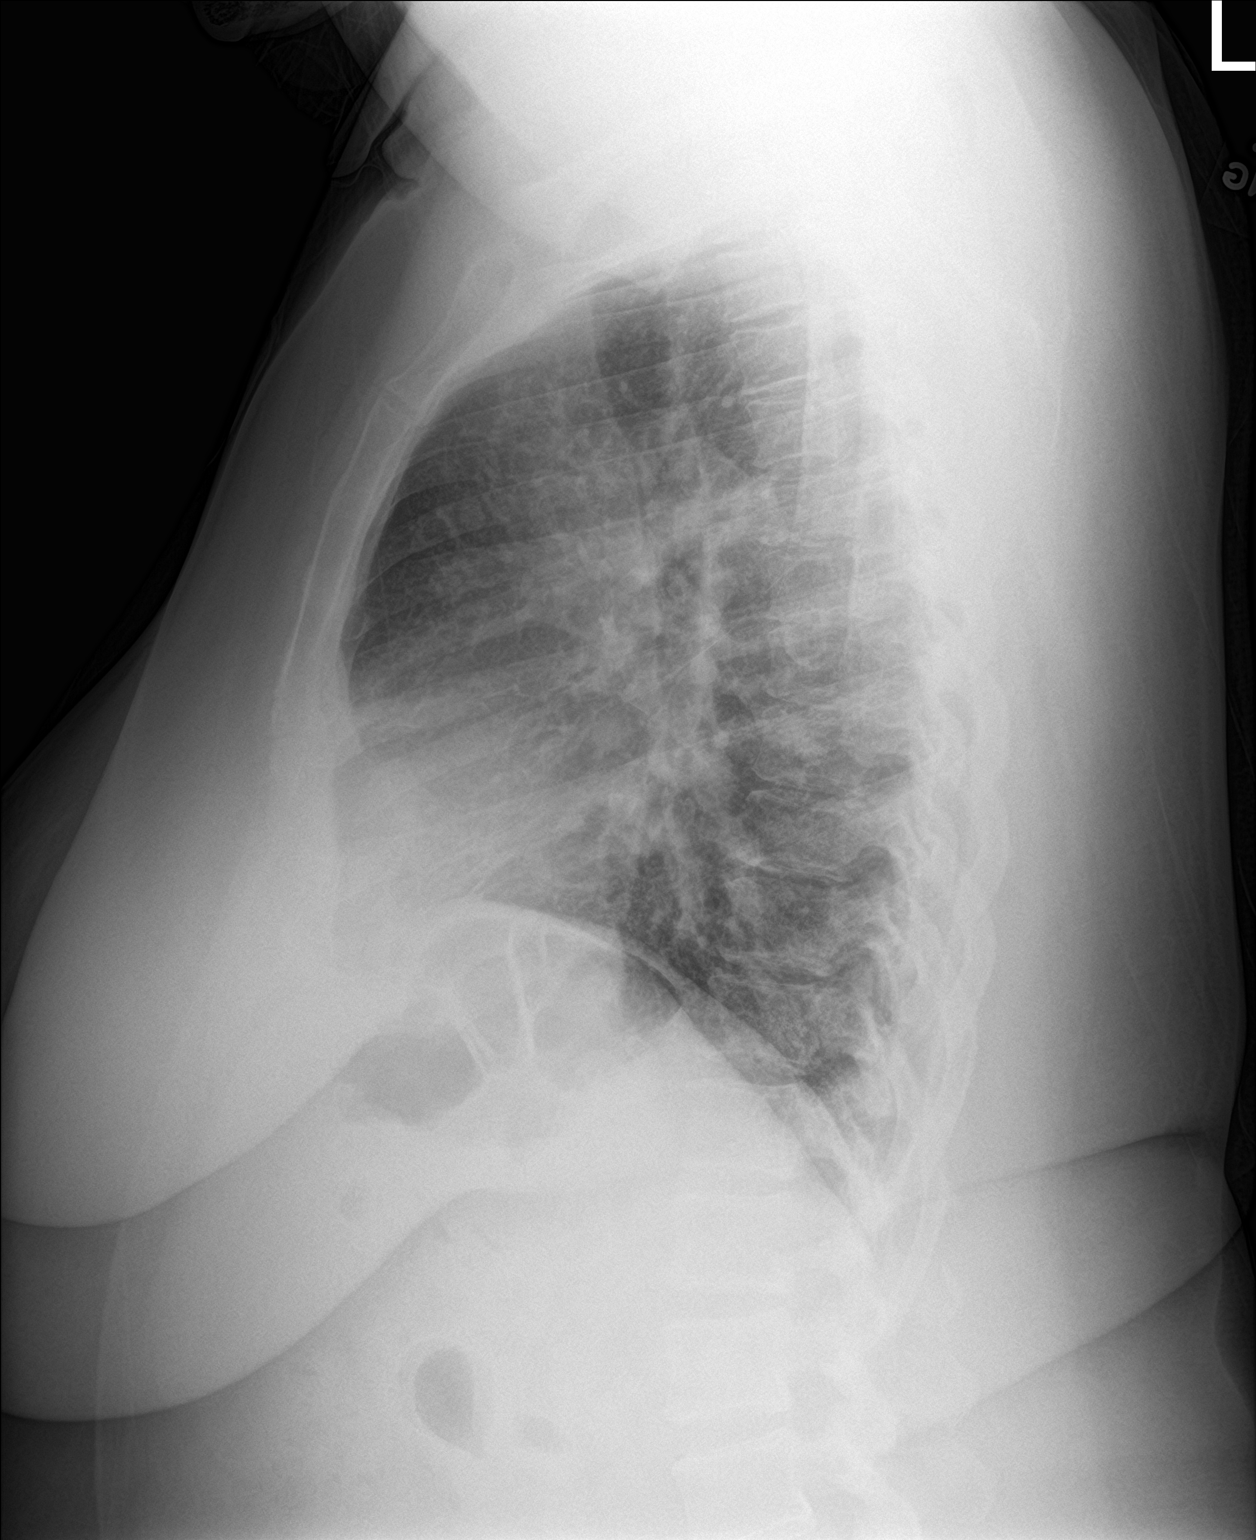

[2 of 2 positions shown; findings below may reference images not displayed]

FINDINGS: There is no edema or consolidation. Heart size and pulmonary
vascularity are normal. No adenopathy. No bone lesions. No
pneumothorax.
IMPRESSION: No edema or consolidation.

## 2017-08-20 ENCOUNTER — Other Ambulatory Visit: Payer: Self-pay | Admitting: Family Medicine

## 2017-08-21 ENCOUNTER — Telehealth: Payer: Self-pay

## 2017-08-21 NOTE — Telephone Encounter (Signed)
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 This can happen with certain medications that help with chronic inflammation. No signs of anemia present. If you develop high fever 100.4 F or greater, chills, body aches or fatigue follow up. Avoid close contacts who are ill.  HIV, Hepatitis B, and syphilis are all negative. Herpes type 1 which is primarily responsible for cold sores is positive. When you have cold sores do not kiss anyone, share utensils, or have oral sex. Genital herpes negative. Liver and kidney function normal

## 2017-08-21 NOTE — Telephone Encounter (Signed)
CMA call regarding lab results   Patient verify DOB  Patient was aware and understood  

## 2017-08-21 NOTE — Telephone Encounter (Signed)
Pt contacted the office and is requesting a call back from [facility] or the nurse. Pt has some questions about her medications

## 2017-08-21 NOTE — Telephone Encounter (Signed)
Pt. Called requesting to speak with her PCP. Pt. States that her nurse called her but she still has questions regarding her results. Pt. Would like to speak with her PCP. Please f/u

## 2017-08-22 NOTE — Telephone Encounter (Signed)
Pt name and DOB verified. Pt is aware of results. Denies concerns or further questions at this time.

## 2017-08-22 NOTE — Telephone Encounter (Signed)
Pt name and DOB verified. She had question about lab results. Encouraged to call office for further questions or concerns.

## 2017-08-24 ENCOUNTER — Other Ambulatory Visit: Payer: Self-pay | Admitting: Family Medicine

## 2017-08-24 DIAGNOSIS — D259 Leiomyoma of uterus, unspecified: Secondary | ICD-10-CM

## 2017-08-28 ENCOUNTER — Telehealth: Payer: Self-pay

## 2017-08-28 NOTE — Telephone Encounter (Signed)
-----   Message from Alfonse Spruce, [place] sent at 08/24/2017 12:26 PM EDT ----- Ultrasound imaging showed small uterine fibroid 1.4 cm in size. Uterine fibroids can cause heavy menstrual bleeding.  You will be referred to gynecology.

## 2017-08-28 NOTE — Telephone Encounter (Signed)
CMA call regarding [place] results   Patient did not answer & unable to leave message

## 2017-09-06 ENCOUNTER — Encounter: Payer: Self-pay | Admitting: *Deleted

## 2017-09-10 ENCOUNTER — Other Ambulatory Visit: Payer: Self-pay | Admitting: Family Medicine

## 2017-09-10 DIAGNOSIS — F5101 Primary insomnia: Secondary | ICD-10-CM

## 2017-09-17 DIAGNOSIS — H35033 Hypertensive retinopathy, bilateral: Secondary | ICD-10-CM | POA: Diagnosis not present

## 2017-09-17 DIAGNOSIS — D8683 Sarcoid iridocyclitis: Secondary | ICD-10-CM | POA: Diagnosis not present

## 2017-09-17 DIAGNOSIS — D869 Sarcoidosis, unspecified: Secondary | ICD-10-CM | POA: Diagnosis not present

## 2017-09-17 DIAGNOSIS — H44111 Panuveitis, right eye: Secondary | ICD-10-CM | POA: Diagnosis not present

## 2017-09-17 DIAGNOSIS — Z947 Corneal transplant status: Secondary | ICD-10-CM | POA: Diagnosis not present

## 2017-09-17 DIAGNOSIS — Z79899 Other long term (current) drug therapy: Secondary | ICD-10-CM | POA: Diagnosis not present

## 2017-09-17 DIAGNOSIS — Z87891 Personal history of nicotine dependence: Secondary | ICD-10-CM | POA: Diagnosis not present

## 2017-09-17 DIAGNOSIS — H2513 Age-related nuclear cataract, bilateral: Secondary | ICD-10-CM | POA: Diagnosis not present

## 2017-09-17 DIAGNOSIS — H30033 Focal chorioretinal inflammation, peripheral, bilateral: Secondary | ICD-10-CM | POA: Diagnosis not present

## 2017-09-17 DIAGNOSIS — H21543 Posterior synechiae (iris), bilateral: Secondary | ICD-10-CM | POA: Diagnosis not present

## 2017-09-17 DIAGNOSIS — H35061 Retinal vasculitis, right eye: Secondary | ICD-10-CM | POA: Diagnosis not present

## 2017-09-17 DIAGNOSIS — I10 Essential (primary) hypertension: Secondary | ICD-10-CM | POA: Diagnosis not present

## 2017-09-24 ENCOUNTER — Telehealth: Payer: Self-pay | Admitting: Family Medicine

## 2017-09-24 NOTE — Telephone Encounter (Signed)
CMA call regarding lab results   Patient verify DOB  Patient was aware and understood  

## 2017-09-24 NOTE — Telephone Encounter (Signed)
Patient called requesting ultrasound results, please f/up

## 2017-09-28 ENCOUNTER — Ambulatory Visit (INDEPENDENT_AMBULATORY_CARE_PROVIDER_SITE_OTHER): Payer: Medicare Other | Admitting: Obstetrics and Gynecology

## 2017-09-28 ENCOUNTER — Encounter: Payer: Self-pay | Admitting: Obstetrics and Gynecology

## 2017-09-28 DIAGNOSIS — I10 Essential (primary) hypertension: Secondary | ICD-10-CM | POA: Diagnosis not present

## 2017-09-28 DIAGNOSIS — D259 Leiomyoma of uterus, unspecified: Secondary | ICD-10-CM | POA: Diagnosis not present

## 2017-09-28 DIAGNOSIS — N939 Abnormal uterine and vaginal bleeding, unspecified: Secondary | ICD-10-CM | POA: Insufficient documentation

## 2017-09-28 NOTE — Progress Notes (Addendum)
[facility] Visit  09/28/2017            Patient name: Marie Wright MRN [id number]  Date of birth: [birth year]  CC & HPI:  Marie Wright is a 46 y.o. female presenting today for heavy menstrual bleeding with onset of 2 months ago. Pt has associated symptoms of pelvic pain (per chart review) and nausea. Pt describes pain as coming and going, and it stays for about 2 hours. Pt was referred to office from [facility] on 08/21/17 due to US imaging showing small uterine fibroid 1.4 cm in size, causing the heavy menstrual bleeding. No alleviating factors noted. Pt denies trying any medications for relief. Pt is not sexually active. Pt is on 3 BP medications, and has been taking BCPs for about 3 months. Pt is not bleeding today. She states her BP has not been controlled properly.  Her last pap smear was on 08/03/17 and normal.  ROS:  ROS  +pelvic pain +pain at vaginal entrance +heavy menstrual bleeding All systems are negative except as noted in the HPI and PMH.    Pertinent History Reviewed:   Reviewed: Significant for tubal ligation, HPT Medical         Past Medical History:  Diagnosis Date   Hypertension    Sarcoidosis                               Surgical Hx:    Past Surgical History:  Procedure Laterality Date   CORNEAL TRANSPLANT Left    TUBAL LIGATION     Medications: Reviewed & Updated - see associated section                       Current Outpatient Prescriptions:    albuterol (PROVENTIL HFA;VENTOLIN HFA) 108 (90 Base) MCG/ACT inhaler, Inhale 2 puffs into the lungs every 6 (six) hours as needed for wheezing or shortness of breath., Disp: 1 Inhaler, Rfl: 2   amLODipine (NORVASC) 5 MG tablet, Take 1 tablet (5 mg total) by mouth daily., Disp: 90 tablet, Rfl: 0   brimonidine (ALPHAGAN) 0.2 % ophthalmic solution, 3 (three) times daily., Disp: , Rfl:    budesonide-formoterol (SYMBICORT) 160-4.5 MCG/ACT inhaler, Inhale 2 puffs into the lungs 2  (two) times daily., Disp: , Rfl:    dorzolamide (TRUSOPT) 2 % ophthalmic solution, 1 drop 3 (three) times daily., Disp: , Rfl:    folic acid (FOLVITE) 1 MG tablet, Take 1 mg by mouth., Disp: , Rfl:    HUMIRA 40 MG/0.4ML PSKT, , Disp: , Rfl:    methotrexate (RHEUMATREX) 2.5 MG tablet, TAKE 3 TABLETS (7.5 MG TOTAL) BY MOUTH ONCE A WEEK. FOR 30 DOSES, Disp: , Rfl:    prednisoLONE acetate (PRED FORTE) 1 % ophthalmic suspension, 1 drop 4 (four) times daily., Disp: , Rfl:    timolol (TIMOPTIC) 0.5 % ophthalmic solution, 1 drop 2 (two) times daily., Disp: , Rfl:    traZODone (DESYREL) 50 MG tablet, TAKE 1 TO 2 TABLETS BY MOUTH AT BEDTIME AS NEEDED FOR SLEEP, Disp: 60 tablet, Rfl: 1   valACYclovir (VALTREX) 1000 MG tablet, Take 1,000 mg by mouth., Disp: , Rfl:    valsartan-hydrochlorothiazide (DIOVAN HCT) 160-25 MG tablet, Take 1 tablet by mouth daily., Disp: 90 tablet, Rfl: 0   acetaminophen (TYLENOL) 500 MG tablet, Take 2 tablets (1,000 mg total) by mouth every 8 (eight) hours as needed for moderate  pain. (Patient not taking: Reported on 09/28/2017), Disp: 30 tablet, Rfl: 0   ketoconazole (NIZORAL) 2 % cream, APPLY ONE APPLICATION TO AFFECTED AREAS TOPICALLY DAILY FOR 3 WEEKS. (Patient not taking: Reported on 09/28/2017), Disp: 30 g, Rfl: 1   norethindrone-ethinyl estradiol (JUNEL FE 1/20) 1-20 MG-MCG tablet, Take 1 tablet by mouth daily. (Patient not taking: Reported on 09/28/2017), Disp: 1 Package, Rfl: 11   predniSONE (DELTASONE) 20 MG tablet, Take 20 mg by mouth daily with breakfast., Disp: , Rfl:    Social History: Reviewed -  reports that she has never smoked. She has never used smokeless tobacco.  Objective Findings:  Vitals: Blood pressure (!) 150/100, weight 211 lb 9.6 oz (96 kg).  Physical Examination: General appearance - alert, well appearing, and in no distress Mental status - alert, oriented to person, place, and time Pelvic -  VULVA: normal appearing vulva with no masses,  tenderness or lesions,  VAGINA: normal appearing vagina with normal color and discharge, no lesions, healthy CERVIX: well supported, lots of clear mucous and secretions UTERUS: uterus is normal size, shape, consistency and nontender,  ADNEXA: mild pain on palpation of support ligaments   Assessment & Plan:   A:  1. Uterine fibroid 1.4 cm on wall of uterus 2. AUB 3. UNcontrolled hypertension. 4 episodic vaginal pain now resolved. P: Pt advised that until BP better controlled, would advise against use of combined OCP 1. Discontinue Junel Fe 1/20, place on Micronor 2. F/u in 6 weeks to determine how to proceed    By signing my name below, I, Marie Wright, attest that this documentation has been prepared under the direction and in the presence of Jonnie Kind, MD. Electronically Signed: Jabier Gauss, Medical Scribe. 09/28/17. 11:38 AM.  I personally performed the services described in this documentation, which was SCRIBED in my presence. The recorded information has been reviewed and considered accurate. It has been edited as necessary during review. Jonnie Kind, MD

## 2017-10-15 ENCOUNTER — Other Ambulatory Visit: Payer: Self-pay | Admitting: Family Medicine

## 2017-10-15 DIAGNOSIS — I10 Essential (primary) hypertension: Secondary | ICD-10-CM

## 2017-10-16 DIAGNOSIS — Z947 Corneal transplant status: Secondary | ICD-10-CM | POA: Diagnosis not present

## 2017-10-16 DIAGNOSIS — H30033 Focal chorioretinal inflammation, peripheral, bilateral: Secondary | ICD-10-CM | POA: Diagnosis not present

## 2017-10-16 DIAGNOSIS — H21543 Posterior synechiae (iris), bilateral: Secondary | ICD-10-CM | POA: Diagnosis not present

## 2017-10-16 DIAGNOSIS — H2513 Age-related nuclear cataract, bilateral: Secondary | ICD-10-CM | POA: Diagnosis not present

## 2017-10-16 DIAGNOSIS — Z79899 Other long term (current) drug therapy: Secondary | ICD-10-CM | POA: Diagnosis not present

## 2017-10-16 DIAGNOSIS — D869 Sarcoidosis, unspecified: Secondary | ICD-10-CM | POA: Diagnosis not present

## 2017-10-16 DIAGNOSIS — H35033 Hypertensive retinopathy, bilateral: Secondary | ICD-10-CM | POA: Diagnosis not present

## 2017-10-29 ENCOUNTER — Other Ambulatory Visit: Payer: Self-pay | Admitting: Family Medicine

## 2017-10-29 DIAGNOSIS — F5101 Primary insomnia: Secondary | ICD-10-CM

## 2017-11-01 ENCOUNTER — Ambulatory Visit: Payer: Medicare Other | Admitting: Family Medicine

## 2017-11-10 ENCOUNTER — Other Ambulatory Visit: Payer: Self-pay | Admitting: Family Medicine

## 2017-11-10 DIAGNOSIS — I10 Essential (primary) hypertension: Secondary | ICD-10-CM

## 2017-11-13 ENCOUNTER — Telehealth: Payer: Self-pay | Admitting: Family Medicine

## 2017-11-13 DIAGNOSIS — H2513 Age-related nuclear cataract, bilateral: Secondary | ICD-10-CM | POA: Diagnosis not present

## 2017-11-13 DIAGNOSIS — D869 Sarcoidosis, unspecified: Secondary | ICD-10-CM | POA: Diagnosis not present

## 2017-11-13 DIAGNOSIS — H30033 Focal chorioretinal inflammation, peripheral, bilateral: Secondary | ICD-10-CM | POA: Diagnosis not present

## 2017-11-13 DIAGNOSIS — H35033 Hypertensive retinopathy, bilateral: Secondary | ICD-10-CM | POA: Diagnosis not present

## 2017-11-13 DIAGNOSIS — Z79899 Other long term (current) drug therapy: Secondary | ICD-10-CM | POA: Diagnosis not present

## 2017-11-13 DIAGNOSIS — Z947 Corneal transplant status: Secondary | ICD-10-CM | POA: Diagnosis not present

## 2017-11-13 DIAGNOSIS — H21543 Posterior synechiae (iris), bilateral: Secondary | ICD-10-CM | POA: Diagnosis not present

## 2017-11-13 NOTE — Telephone Encounter (Signed)
Pt called to request a refill for traZODone (DESYREL) 50 MG tablet Please follow up

## 2017-11-14 ENCOUNTER — Ambulatory Visit: Payer: Medicare Other | Admitting: Obstetrics and Gynecology

## 2017-11-14 ENCOUNTER — Other Ambulatory Visit: Payer: Self-pay | Admitting: Family Medicine

## 2017-11-14 DIAGNOSIS — F5101 Primary insomnia: Secondary | ICD-10-CM

## 2017-11-14 MED ORDER — TRAZODONE HCL 50 MG PO TABS: 50.0000 mg | ORAL_TABLET | Freq: Every evening | ORAL | 1 refills | Status: DC | PRN

## 2017-11-14 NOTE — Telephone Encounter (Signed)
Refill sent.

## 2017-11-14 NOTE — Telephone Encounter (Signed)
CMA call regarding medication refill sent to pharmacy   Patient ddi not answer but left a detailed message per DPR & if have any questions just to call back

## 2017-11-21 ENCOUNTER — Ambulatory Visit (INDEPENDENT_AMBULATORY_CARE_PROVIDER_SITE_OTHER): Payer: Medicare Other | Admitting: Obstetrics and Gynecology

## 2017-11-21 ENCOUNTER — Encounter: Payer: Self-pay | Admitting: Obstetrics and Gynecology

## 2017-11-21 ENCOUNTER — Other Ambulatory Visit: Payer: Self-pay

## 2017-11-21 VITALS — BP 122/86 | HR 92 | Ht 62.0 in | Wt 210.0 lb

## 2017-11-21 DIAGNOSIS — N939 Abnormal uterine and vaginal bleeding, unspecified: Secondary | ICD-10-CM

## 2017-11-21 DIAGNOSIS — D251 Intramural leiomyoma of uterus: Secondary | ICD-10-CM | POA: Diagnosis not present

## 2017-11-21 NOTE — Progress Notes (Signed)
Patient ID: Marie Wright, female   DOB: [birth year], 46 y.o.   MRN: [id number]   [facility] Visit  @DATE @            Patient name: Marie Wright MRN [id number]  Date of birth: [birth year]  CC & HPI:  Marie Wright is a 46 y.o. female presenting today for a follow- up of her uterine fibroid, that was found by U/S on 08/21/2017 at [facility] in [place]. She was then referred to our care and was seen here on 09/28/2017 for AUB that started around the beginning of August. She continues to bleed about 4-5 days each month and notes that she will sometimes experience bleeding twice a month while on Progesterone only OCP. Marland Kitchen She is currently taking Junel FE. Her blood pressure is better controlled since starting Norvasc. She denies any other complaints or symptoms at this time.  ROS:  ROS +AUB All systems are negative except as noted in the HPI and PMH.   Pertinent History Reviewed:   Reviewed: Significant for HTN, tubal ligation Medical         Past Medical History:  Diagnosis Date  . Hypertension   . Sarcoidosis                               Surgical Hx:    Past Surgical History:  Procedure Laterality Date  . CORNEAL TRANSPLANT Left   . TUBAL LIGATION     Medications: Reviewed & Updated - see associated section                       Current Outpatient Medications:  .  acetaminophen (TYLENOL) 500 MG tablet, Take 2 tablets (1,000 mg total) by mouth every 8 (eight) hours as needed for moderate pain., Disp: 30 tablet, Rfl: 0 .  albuterol (PROVENTIL HFA;VENTOLIN HFA) 108 (90 Base) MCG/ACT inhaler, Inhale 2 puffs into the lungs every 6 (six) hours as needed for wheezing or shortness of breath., Disp: 1 Inhaler, Rfl: 2 .  amLODipine (NORVASC) 5 MG tablet, TAKE 1 TABLET BY MOUTH EVERY DAY, Disp: 90 tablet, Rfl: 0 .  brimonidine (ALPHAGAN) 0.2 % ophthalmic solution, 3 (three) times daily., Disp: , Rfl:  .  budesonide-formoterol (SYMBICORT) 160-4.5 MCG/ACT inhaler, Inhale 2 puffs  into the lungs 2 (two) times daily., Disp: , Rfl:  .  dorzolamide (TRUSOPT) 2 % ophthalmic solution, 1 drop 3 (three) times daily., Disp: , Rfl:  .  folic acid (FOLVITE) 1 MG tablet, Take 1 mg by mouth., Disp: , Rfl:  .  HUMIRA 40 MG/0.4ML PSKT, , Disp: , Rfl:  .  prednisoLONE acetate (PRED FORTE) 1 % ophthalmic suspension, 1 drop 4 (four) times daily., Disp: , Rfl:  .  timolol (TIMOPTIC) 0.5 % ophthalmic solution, 1 drop 2 (two) times daily., Disp: , Rfl:  .  traZODone (DESYREL) 50 MG tablet, Take 1-2 tablets (50-100 mg total) by mouth at bedtime as needed. for sleep, Disp: 60 tablet, Rfl: 1 .  valACYclovir (VALTREX) 1000 MG tablet, Take 1,000 mg by mouth., Disp: , Rfl:  .  valsartan-hydrochlorothiazide (DIOVAN-HCT) 160-25 MG tablet, TAKE 1 TABLET BY MOUTH EVERY DAY, Disp: 30 tablet, Rfl: 0 .  norethindrone-ethinyl estradiol (JUNEL FE 1/20) 1-20 MG-MCG tablet, Take 1 tablet by mouth daily. (Patient not taking: Reported on 09/28/2017), Disp: 1 Package, Rfl: 11 .  predniSONE (DELTASONE) 20 MG tablet, Take 20 mg by mouth daily  with breakfast., Disp: , Rfl:    Social History: Reviewed -  reports that  has never smoked. she has never used smokeless tobacco.  Objective Findings:  Vitals: Blood pressure 122/86, pulse 92, height 5\' 2"  (1.575 m), weight 210 lb (95.3 kg).  Physical Examination: General appearance - alert, well appearing, and in no distress, oriented to person, place, and time and overweight Mental status - alert, oriented to person, place, and time, normal mood, behavior, speech, dress, motor activity, and thought processes, affect appropriate to mood   Assessment & Plan:   A:  1. AUB 2. Uterine fibroid 1.4 cm, intramural, 120 gm uterus. 3. Hypertension, stable  P:  1. F/U 2-3 weeks IUD insertion 2. Continue Junel FE until IUD insertion    By signing my name below, I, Margit Banda, attest that this documentation has been prepared under the direction and in the presence of  Jonnie Kind, MD. Electronically Signed: Margit Banda, Medical Scribe. 11/21/17. 9:29 AM.  I personally performed the services described in this documentation, which was SCRIBED in my presence. The recorded information has been reviewed and considered accurate. It has been edited as necessary during review. Jonnie Kind, MD

## 2017-11-23 ENCOUNTER — Ambulatory Visit: Payer: Medicare Other | Admitting: Family Medicine

## 2017-11-26 ENCOUNTER — Telehealth: Payer: Self-pay | Admitting: Obstetrics and Gynecology

## 2017-11-28 ENCOUNTER — Ambulatory Visit: Payer: Medicare Other | Admitting: Family Medicine

## 2017-12-04 ENCOUNTER — Other Ambulatory Visit: Payer: Self-pay | Admitting: Obstetrics and Gynecology

## 2017-12-04 DIAGNOSIS — Z30011 Encounter for initial prescription of contraceptive pills: Secondary | ICD-10-CM

## 2017-12-04 DIAGNOSIS — N924 Excessive bleeding in the premenopausal period: Secondary | ICD-10-CM

## 2017-12-04 MED ORDER — NORETHIN ACE-ETH ESTRAD-FE 1-20 MG-MCG PO TABS: 1.0000 | ORAL_TABLET | Freq: Every day | ORAL | 11 refills | Status: DC

## 2017-12-04 NOTE — Telephone Encounter (Signed)
LMOVM that Junel was sent to pharmacy.  Pt should stay on Junel FE, but if her BP gets worse when she's on bcp's, he would switch her to a progesterone only pill Micronor. Advised patient to call if she had further questions.

## 2017-12-04 NOTE — Telephone Encounter (Signed)
Pt should stay on Junel FE,  If her BP gets worse when she's on bcp's , I would switch her to a progesterone only pill.Micronor. I have sent in an Rx for Junel FE. X 1 yr.

## 2017-12-04 NOTE — Progress Notes (Signed)
Refilled junel FE.x 1 yr

## 2017-12-05 ENCOUNTER — Other Ambulatory Visit: Payer: Self-pay | Admitting: *Deleted

## 2017-12-05 DIAGNOSIS — Z30011 Encounter for initial prescription of contraceptive pills: Secondary | ICD-10-CM

## 2017-12-05 DIAGNOSIS — N924 Excessive bleeding in the premenopausal period: Secondary | ICD-10-CM

## 2017-12-05 MED ORDER — NORETHIN ACE-ETH ESTRAD-FE 1-20 MG-MCG PO TABS: 1.0000 | ORAL_TABLET | Freq: Every day | ORAL | 11 refills | Status: AC

## 2017-12-09 ENCOUNTER — Other Ambulatory Visit: Payer: Self-pay | Admitting: Family Medicine

## 2017-12-09 DIAGNOSIS — I10 Essential (primary) hypertension: Secondary | ICD-10-CM

## 2017-12-10 NOTE — Telephone Encounter (Signed)
CMA call regarding medication refill   Patient needs to schedule an appt an order for a refill   Patient did not answer but left a detailed message & to call back

## 2017-12-28 DIAGNOSIS — H21543 Posterior synechiae (iris), bilateral: Secondary | ICD-10-CM | POA: Diagnosis not present

## 2017-12-28 DIAGNOSIS — H2513 Age-related nuclear cataract, bilateral: Secondary | ICD-10-CM | POA: Diagnosis not present

## 2017-12-28 DIAGNOSIS — D869 Sarcoidosis, unspecified: Secondary | ICD-10-CM | POA: Diagnosis not present

## 2017-12-28 DIAGNOSIS — H35033 Hypertensive retinopathy, bilateral: Secondary | ICD-10-CM | POA: Diagnosis not present

## 2017-12-28 DIAGNOSIS — Z947 Corneal transplant status: Secondary | ICD-10-CM | POA: Diagnosis not present

## 2017-12-28 DIAGNOSIS — Z79899 Other long term (current) drug therapy: Secondary | ICD-10-CM | POA: Diagnosis not present

## 2017-12-28 DIAGNOSIS — H30033 Focal chorioretinal inflammation, peripheral, bilateral: Secondary | ICD-10-CM | POA: Diagnosis not present

## 2018-01-09 ENCOUNTER — Other Ambulatory Visit: Payer: Self-pay | Admitting: Family Medicine

## 2018-01-09 DIAGNOSIS — I10 Essential (primary) hypertension: Secondary | ICD-10-CM

## 2018-01-19 ENCOUNTER — Other Ambulatory Visit: Payer: Self-pay | Admitting: Internal Medicine

## 2018-01-19 DIAGNOSIS — I10 Essential (primary) hypertension: Secondary | ICD-10-CM

## 2018-01-23 ENCOUNTER — Other Ambulatory Visit: Payer: Self-pay | Admitting: Internal Medicine

## 2018-01-23 DIAGNOSIS — I10 Essential (primary) hypertension: Secondary | ICD-10-CM

## 2018-01-30 DIAGNOSIS — H30101 Unspecified disseminated chorioretinal inflammation, right eye: Secondary | ICD-10-CM | POA: Diagnosis not present

## 2018-01-30 DIAGNOSIS — H21543 Posterior synechiae (iris), bilateral: Secondary | ICD-10-CM | POA: Diagnosis not present

## 2018-01-30 DIAGNOSIS — H4301 Vitreous prolapse, right eye: Secondary | ICD-10-CM | POA: Diagnosis not present

## 2018-01-30 DIAGNOSIS — H30033 Focal chorioretinal inflammation, peripheral, bilateral: Secondary | ICD-10-CM | POA: Diagnosis not present

## 2018-01-30 DIAGNOSIS — I110 Hypertensive heart disease with heart failure: Secondary | ICD-10-CM | POA: Diagnosis not present

## 2018-01-30 DIAGNOSIS — Z6837 Body mass index (BMI) 37.0-37.9, adult: Secondary | ICD-10-CM | POA: Diagnosis not present

## 2018-01-30 DIAGNOSIS — Z79899 Other long term (current) drug therapy: Secondary | ICD-10-CM | POA: Diagnosis not present

## 2018-01-30 DIAGNOSIS — H30031 Focal chorioretinal inflammation, peripheral, right eye: Secondary | ICD-10-CM | POA: Diagnosis not present

## 2018-01-30 DIAGNOSIS — G4700 Insomnia, unspecified: Secondary | ICD-10-CM | POA: Diagnosis not present

## 2018-01-30 DIAGNOSIS — I5032 Chronic diastolic (congestive) heart failure: Secondary | ICD-10-CM | POA: Diagnosis not present

## 2018-01-30 DIAGNOSIS — H30001 Unspecified focal chorioretinal inflammation, right eye: Secondary | ICD-10-CM | POA: Diagnosis not present

## 2018-01-30 DIAGNOSIS — G4733 Obstructive sleep apnea (adult) (pediatric): Secondary | ICD-10-CM | POA: Diagnosis not present

## 2018-01-30 DIAGNOSIS — D8689 Sarcoidosis of other sites: Secondary | ICD-10-CM | POA: Diagnosis not present

## 2018-01-30 DIAGNOSIS — H1589 Other disorders of sclera: Secondary | ICD-10-CM | POA: Diagnosis not present

## 2018-01-30 DIAGNOSIS — Z947 Corneal transplant status: Secondary | ICD-10-CM | POA: Diagnosis not present

## 2018-01-30 DIAGNOSIS — Z7951 Long term (current) use of inhaled steroids: Secondary | ICD-10-CM | POA: Diagnosis not present

## 2018-01-30 DIAGNOSIS — Z9989 Dependence on other enabling machines and devices: Secondary | ICD-10-CM | POA: Diagnosis not present

## 2018-02-04 DIAGNOSIS — Z947 Corneal transplant status: Secondary | ICD-10-CM | POA: Diagnosis not present

## 2018-02-04 DIAGNOSIS — D869 Sarcoidosis, unspecified: Secondary | ICD-10-CM | POA: Diagnosis not present

## 2018-02-04 DIAGNOSIS — H30033 Focal chorioretinal inflammation, peripheral, bilateral: Secondary | ICD-10-CM | POA: Diagnosis not present

## 2018-02-04 DIAGNOSIS — H2513 Age-related nuclear cataract, bilateral: Secondary | ICD-10-CM | POA: Diagnosis not present

## 2018-02-04 DIAGNOSIS — Z79899 Other long term (current) drug therapy: Secondary | ICD-10-CM | POA: Diagnosis not present

## 2018-02-04 DIAGNOSIS — H21543 Posterior synechiae (iris), bilateral: Secondary | ICD-10-CM | POA: Diagnosis not present

## 2018-02-04 DIAGNOSIS — H35033 Hypertensive retinopathy, bilateral: Secondary | ICD-10-CM | POA: Diagnosis not present

## 2018-02-13 IMAGING — US US PELVIS COMPLETE
1 series · 15 of 25 positions shown · non-contrast
Comparison: None

CLINICAL DATA: Perimenopausal menorrhagia



[Series 1: us pelvis complete · 15 of 62 slices shown]
[im 1/62]
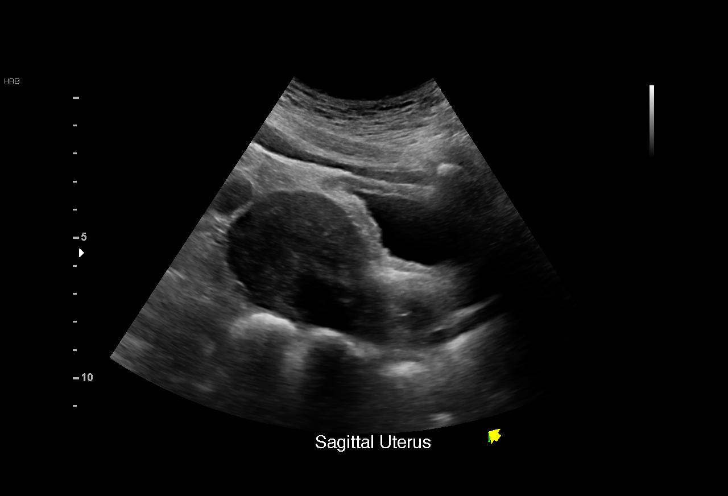
[im 6/62]
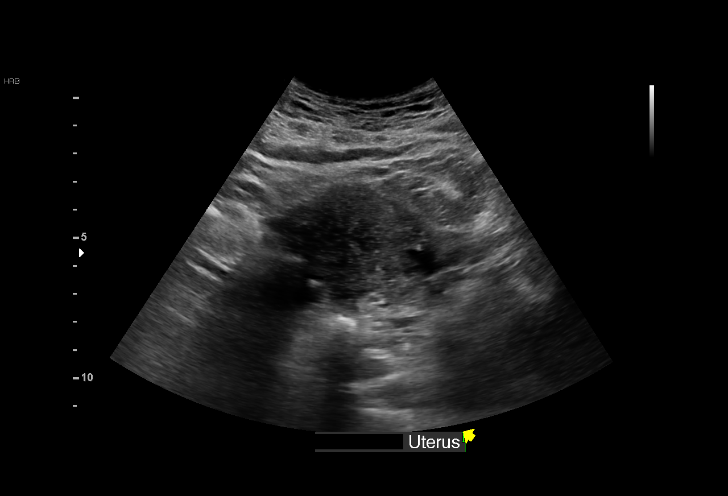
[im 11/62]
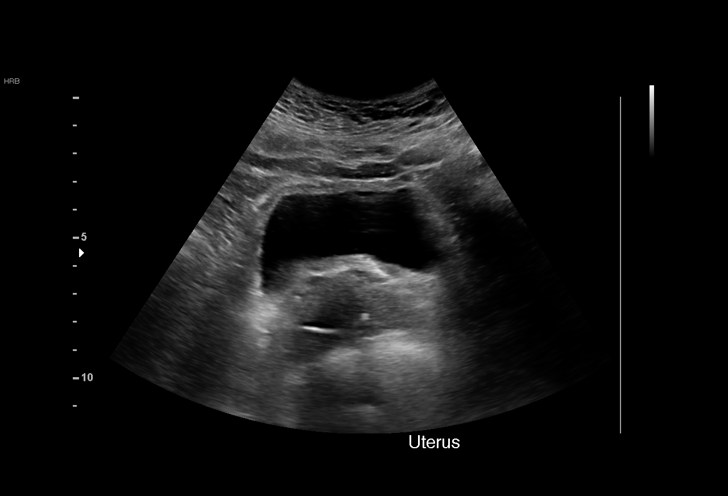
[im 13/62]
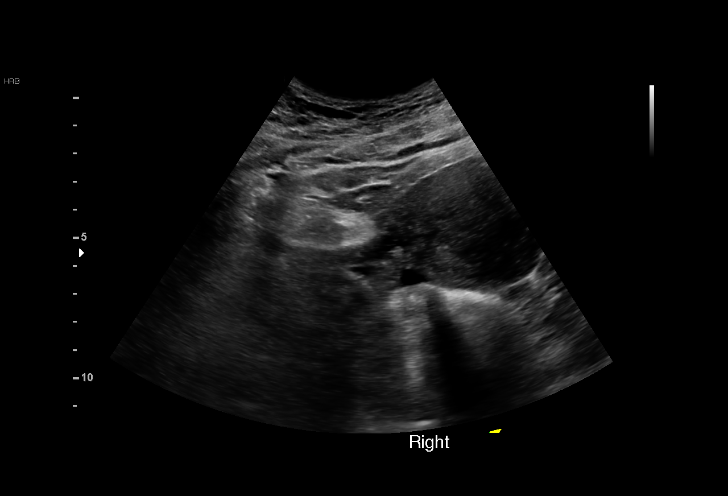
[im 18/62]
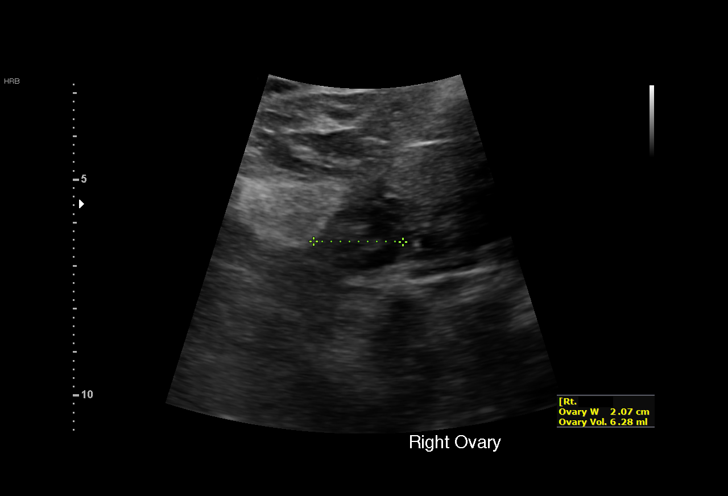
[im 23/62]
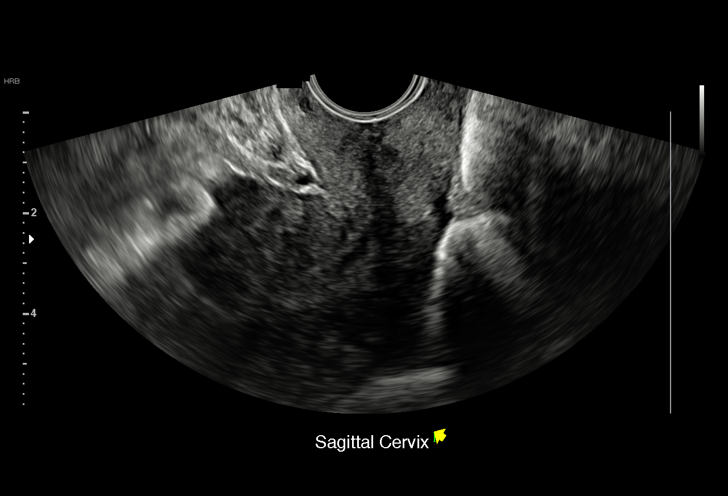
[im 26/62]
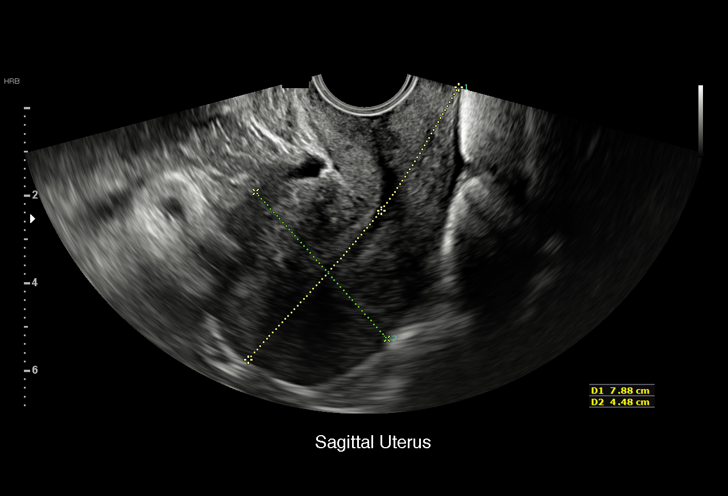
[im 31/62]
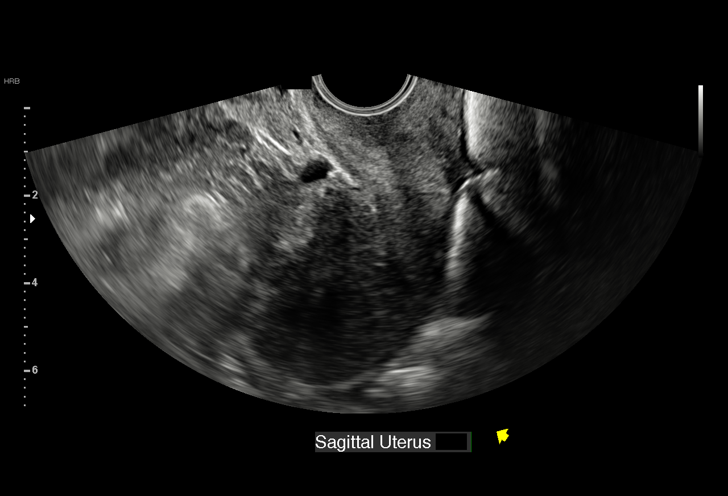
[im 36/62]
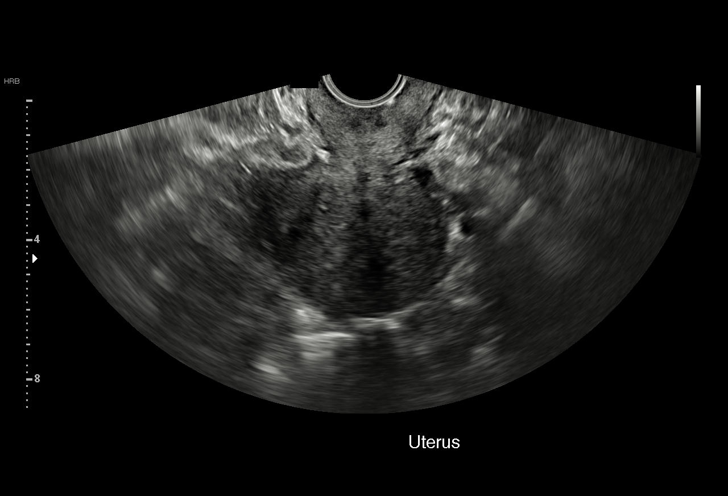
[im 39/62]
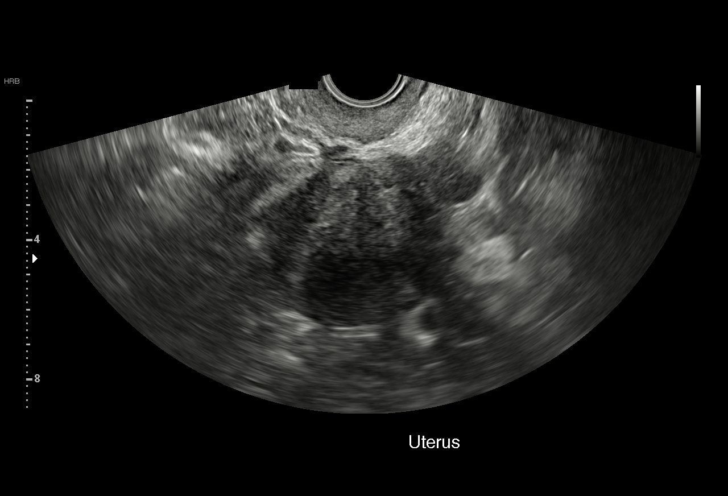
[im 44/62]
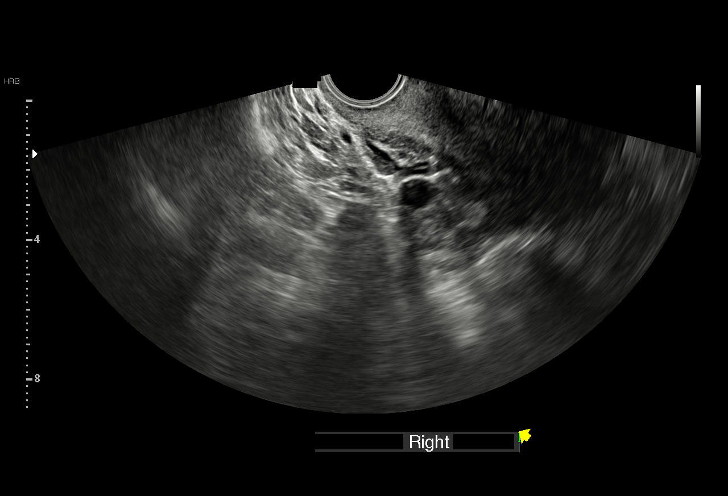
[im 49/62]
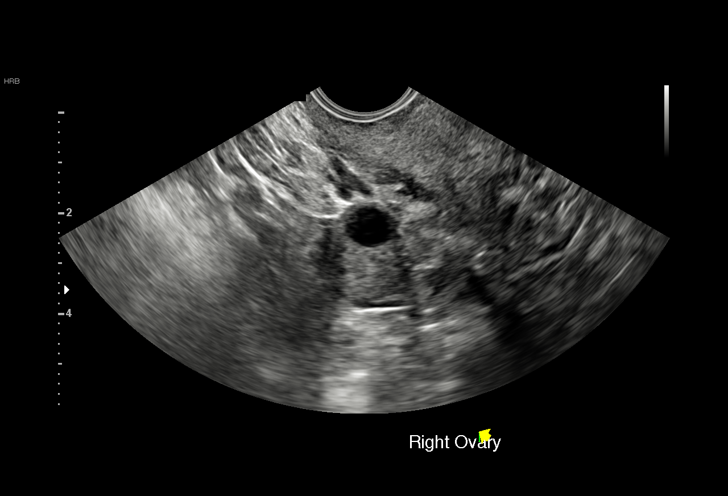
[im 51/62]
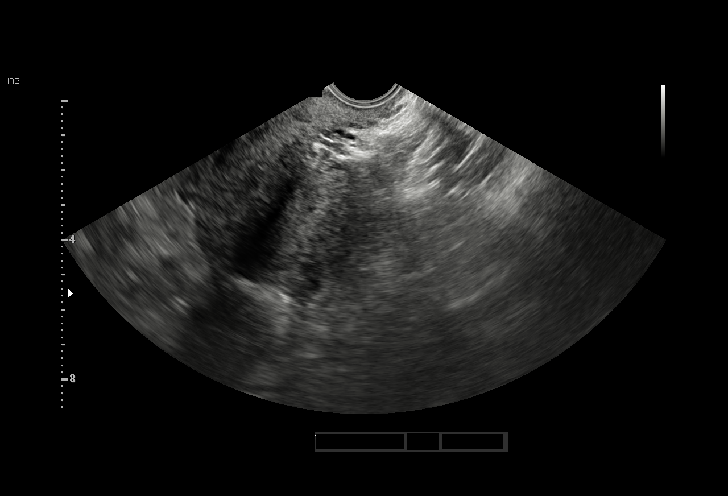
[im 56/62]
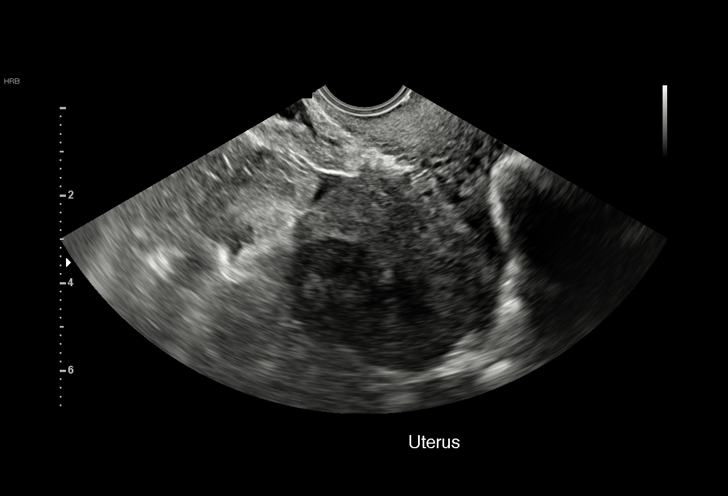
[im 62/62]
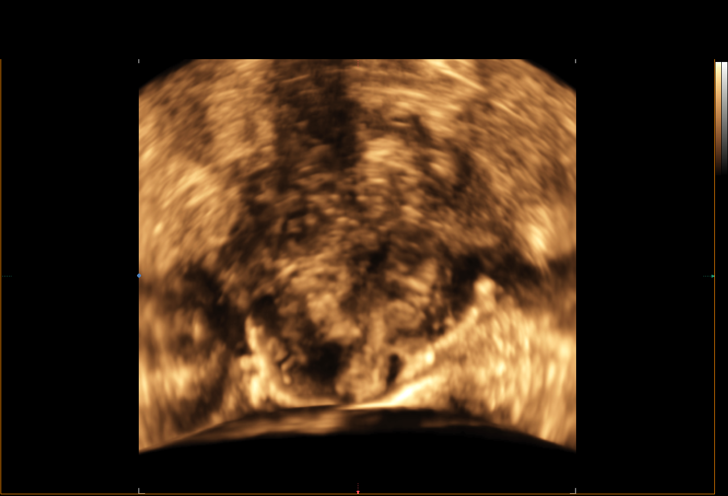

[15 of 25 positions shown; findings below may reference images not displayed]

FINDINGS: Uterus

Measurements: 8.8 x 4.7 x 5.1 cm. 1.4 cm the left fundal fibroid,
intramural in location.

Endometrium

Thickness: 6 mm in thickness.  No focal abnormality visualized.

Right ovary

Measurements: 3.0 x 2.1 x 2.0 cm. Normal appearance/no adnexal mass.

Left ovary

Measurements: Not visualized. No adnexal mass seen.

Other findings

No abnormal free fluid.
IMPRESSION: Small left fundal fibroid, 1.4 cm.

Endometrium normal in thickness without focal abnormality.

## 2018-02-25 ENCOUNTER — Other Ambulatory Visit: Payer: Self-pay | Admitting: Pharmacist

## 2018-02-25 DIAGNOSIS — F5101 Primary insomnia: Secondary | ICD-10-CM

## 2018-02-25 MED ORDER — TRAZODONE HCL 50 MG PO TABS: 50.0000 mg | ORAL_TABLET | Freq: Every evening | ORAL | 0 refills | Status: AC | PRN

## 2018-03-01 ENCOUNTER — Other Ambulatory Visit: Payer: Self-pay | Admitting: Pharmacist

## 2018-03-01 DIAGNOSIS — I10 Essential (primary) hypertension: Secondary | ICD-10-CM

## 2018-03-01 MED ORDER — AMLODIPINE BESYLATE 5 MG PO TABS: 5.0000 mg | ORAL_TABLET | Freq: Every day | ORAL | 0 refills | Status: AC

## 2018-03-04 DIAGNOSIS — H21543 Posterior synechiae (iris), bilateral: Secondary | ICD-10-CM | POA: Diagnosis not present

## 2018-03-04 DIAGNOSIS — H35033 Hypertensive retinopathy, bilateral: Secondary | ICD-10-CM | POA: Diagnosis not present

## 2018-03-04 DIAGNOSIS — H30033 Focal chorioretinal inflammation, peripheral, bilateral: Secondary | ICD-10-CM | POA: Diagnosis not present

## 2018-03-04 DIAGNOSIS — Z947 Corneal transplant status: Secondary | ICD-10-CM | POA: Diagnosis not present

## 2018-03-04 DIAGNOSIS — H2513 Age-related nuclear cataract, bilateral: Secondary | ICD-10-CM | POA: Diagnosis not present

## 2018-03-04 DIAGNOSIS — Z48810 Encounter for surgical aftercare following surgery on the sense organs: Secondary | ICD-10-CM | POA: Diagnosis not present

## 2018-03-04 DIAGNOSIS — H35371 Puckering of macula, right eye: Secondary | ICD-10-CM | POA: Diagnosis not present

## 2018-03-04 DIAGNOSIS — Z79899 Other long term (current) drug therapy: Secondary | ICD-10-CM | POA: Diagnosis not present

## 2018-03-04 DIAGNOSIS — D869 Sarcoidosis, unspecified: Secondary | ICD-10-CM | POA: Diagnosis not present

## 2018-03-29 ENCOUNTER — Other Ambulatory Visit: Payer: Self-pay | Admitting: Internal Medicine

## 2018-03-29 DIAGNOSIS — I10 Essential (primary) hypertension: Secondary | ICD-10-CM

## 2018-04-01 DIAGNOSIS — Z79899 Other long term (current) drug therapy: Secondary | ICD-10-CM | POA: Diagnosis not present

## 2018-04-01 DIAGNOSIS — Z947 Corneal transplant status: Secondary | ICD-10-CM | POA: Diagnosis not present

## 2018-04-01 DIAGNOSIS — H21543 Posterior synechiae (iris), bilateral: Secondary | ICD-10-CM | POA: Diagnosis not present

## 2018-04-01 DIAGNOSIS — H35033 Hypertensive retinopathy, bilateral: Secondary | ICD-10-CM | POA: Diagnosis not present

## 2018-04-01 DIAGNOSIS — Z48810 Encounter for surgical aftercare following surgery on the sense organs: Secondary | ICD-10-CM | POA: Diagnosis not present

## 2018-04-01 DIAGNOSIS — I10 Essential (primary) hypertension: Secondary | ICD-10-CM | POA: Diagnosis not present

## 2018-04-01 DIAGNOSIS — D869 Sarcoidosis, unspecified: Secondary | ICD-10-CM | POA: Diagnosis not present

## 2018-04-01 DIAGNOSIS — H30033 Focal chorioretinal inflammation, peripheral, bilateral: Secondary | ICD-10-CM | POA: Diagnosis not present

## 2018-04-01 DIAGNOSIS — H2513 Age-related nuclear cataract, bilateral: Secondary | ICD-10-CM | POA: Diagnosis not present

## 2018-04-01 DIAGNOSIS — H35371 Puckering of macula, right eye: Secondary | ICD-10-CM | POA: Diagnosis not present

## 2018-04-03 ENCOUNTER — Other Ambulatory Visit: Payer: Self-pay | Admitting: Internal Medicine

## 2018-04-03 DIAGNOSIS — I10 Essential (primary) hypertension: Secondary | ICD-10-CM

## 2018-04-29 DIAGNOSIS — H21543 Posterior synechiae (iris), bilateral: Secondary | ICD-10-CM | POA: Diagnosis not present

## 2018-04-29 DIAGNOSIS — Z79899 Other long term (current) drug therapy: Secondary | ICD-10-CM | POA: Diagnosis not present

## 2018-04-29 DIAGNOSIS — Z48810 Encounter for surgical aftercare following surgery on the sense organs: Secondary | ICD-10-CM | POA: Diagnosis not present

## 2018-04-29 DIAGNOSIS — D869 Sarcoidosis, unspecified: Secondary | ICD-10-CM | POA: Diagnosis not present

## 2018-04-29 DIAGNOSIS — H44113 Panuveitis, bilateral: Secondary | ICD-10-CM | POA: Diagnosis not present

## 2018-04-29 DIAGNOSIS — H35033 Hypertensive retinopathy, bilateral: Secondary | ICD-10-CM | POA: Diagnosis not present

## 2018-04-29 DIAGNOSIS — Z947 Corneal transplant status: Secondary | ICD-10-CM | POA: Diagnosis not present

## 2018-04-29 DIAGNOSIS — H35371 Puckering of macula, right eye: Secondary | ICD-10-CM | POA: Diagnosis not present

## 2018-04-29 DIAGNOSIS — H30033 Focal chorioretinal inflammation, peripheral, bilateral: Secondary | ICD-10-CM | POA: Diagnosis not present

## 2018-04-29 DIAGNOSIS — H2513 Age-related nuclear cataract, bilateral: Secondary | ICD-10-CM | POA: Diagnosis not present

## 2018-10-09 DIAGNOSIS — D869 Sarcoidosis, unspecified: Secondary | ICD-10-CM | POA: Diagnosis not present

## 2018-10-09 DIAGNOSIS — I10 Essential (primary) hypertension: Secondary | ICD-10-CM | POA: Diagnosis not present

## 2018-10-09 DIAGNOSIS — E669 Obesity, unspecified: Secondary | ICD-10-CM | POA: Diagnosis not present

## 2018-10-09 DIAGNOSIS — R05 Cough: Secondary | ICD-10-CM | POA: Diagnosis not present

## 2018-10-09 DIAGNOSIS — R0789 Other chest pain: Secondary | ICD-10-CM | POA: Diagnosis not present

## 2018-10-09 DIAGNOSIS — Z6834 Body mass index (BMI) 34.0-34.9, adult: Secondary | ICD-10-CM | POA: Diagnosis not present

## 2018-10-09 DIAGNOSIS — R079 Chest pain, unspecified: Secondary | ICD-10-CM | POA: Diagnosis not present

## 2018-10-09 DIAGNOSIS — M940 Chondrocostal junction syndrome [Tietze]: Secondary | ICD-10-CM | POA: Diagnosis not present

## 2018-10-09 DIAGNOSIS — H5713 Ocular pain, bilateral: Secondary | ICD-10-CM | POA: Diagnosis not present

## 2018-10-10 DIAGNOSIS — R079 Chest pain, unspecified: Secondary | ICD-10-CM | POA: Diagnosis not present

## 2018-10-10 DIAGNOSIS — M940 Chondrocostal junction syndrome [Tietze]: Secondary | ICD-10-CM | POA: Diagnosis not present

## 2018-10-10 DIAGNOSIS — R0602 Shortness of breath: Secondary | ICD-10-CM | POA: Diagnosis not present

## 2018-11-14 DIAGNOSIS — I10 Essential (primary) hypertension: Secondary | ICD-10-CM | POA: Diagnosis not present

## 2018-11-14 DIAGNOSIS — D869 Sarcoidosis, unspecified: Secondary | ICD-10-CM | POA: Diagnosis not present

## 2018-11-14 DIAGNOSIS — G4709 Other insomnia: Secondary | ICD-10-CM | POA: Diagnosis not present

## 2018-11-14 DIAGNOSIS — Z23 Encounter for immunization: Secondary | ICD-10-CM | POA: Diagnosis not present

## 2018-11-14 DIAGNOSIS — Z1231 Encounter for screening mammogram for malignant neoplasm of breast: Secondary | ICD-10-CM | POA: Diagnosis not present

## 2018-11-14 DIAGNOSIS — Z13220 Encounter for screening for lipoid disorders: Secondary | ICD-10-CM | POA: Diagnosis not present

## 2018-11-14 DIAGNOSIS — M25552 Pain in left hip: Secondary | ICD-10-CM | POA: Diagnosis not present

## 2018-11-26 DIAGNOSIS — M5442 Lumbago with sciatica, left side: Secondary | ICD-10-CM | POA: Diagnosis not present

## 2018-11-26 DIAGNOSIS — I10 Essential (primary) hypertension: Secondary | ICD-10-CM | POA: Diagnosis not present

## 2018-11-26 DIAGNOSIS — R0602 Shortness of breath: Secondary | ICD-10-CM | POA: Diagnosis not present

## 2018-11-26 DIAGNOSIS — Z79899 Other long term (current) drug therapy: Secondary | ICD-10-CM | POA: Diagnosis not present

## 2018-11-26 DIAGNOSIS — M545 Low back pain: Secondary | ICD-10-CM | POA: Diagnosis not present

## 2018-11-26 DIAGNOSIS — G4730 Sleep apnea, unspecified: Secondary | ICD-10-CM | POA: Diagnosis not present

## 2018-11-26 DIAGNOSIS — D860 Sarcoidosis of lung: Secondary | ICD-10-CM | POA: Diagnosis not present

## 2018-11-26 DIAGNOSIS — D869 Sarcoidosis, unspecified: Secondary | ICD-10-CM | POA: Diagnosis not present

## 2018-12-12 DIAGNOSIS — M25552 Pain in left hip: Secondary | ICD-10-CM | POA: Diagnosis not present

## 2018-12-12 DIAGNOSIS — I10 Essential (primary) hypertension: Secondary | ICD-10-CM | POA: Diagnosis not present

## 2018-12-12 DIAGNOSIS — Z1231 Encounter for screening mammogram for malignant neoplasm of breast: Secondary | ICD-10-CM | POA: Diagnosis not present

## 2018-12-26 DIAGNOSIS — M5410 Radiculopathy, site unspecified: Secondary | ICD-10-CM | POA: Diagnosis not present

## 2018-12-26 DIAGNOSIS — M25552 Pain in left hip: Secondary | ICD-10-CM | POA: Diagnosis not present

## 2019-01-02 DIAGNOSIS — M25552 Pain in left hip: Secondary | ICD-10-CM | POA: Diagnosis not present

## 2019-01-03 DIAGNOSIS — M25552 Pain in left hip: Secondary | ICD-10-CM | POA: Diagnosis not present

## 2019-01-06 DIAGNOSIS — M25552 Pain in left hip: Secondary | ICD-10-CM | POA: Diagnosis not present

## 2019-01-09 DIAGNOSIS — M25552 Pain in left hip: Secondary | ICD-10-CM | POA: Diagnosis not present

## 2019-01-13 DIAGNOSIS — M25552 Pain in left hip: Secondary | ICD-10-CM | POA: Diagnosis not present

## 2019-01-17 DIAGNOSIS — M25552 Pain in left hip: Secondary | ICD-10-CM | POA: Diagnosis not present

## 2019-01-20 DIAGNOSIS — M25552 Pain in left hip: Secondary | ICD-10-CM | POA: Diagnosis not present

## 2019-03-11 DIAGNOSIS — H21543 Posterior synechiae (iris), bilateral: Secondary | ICD-10-CM | POA: Diagnosis not present

## 2019-03-11 DIAGNOSIS — D869 Sarcoidosis, unspecified: Secondary | ICD-10-CM | POA: Diagnosis not present

## 2019-03-11 DIAGNOSIS — Z79899 Other long term (current) drug therapy: Secondary | ICD-10-CM | POA: Diagnosis not present

## 2019-03-11 DIAGNOSIS — H30033 Focal chorioretinal inflammation, peripheral, bilateral: Secondary | ICD-10-CM | POA: Diagnosis not present

## 2019-03-11 DIAGNOSIS — H44113 Panuveitis, bilateral: Secondary | ICD-10-CM | POA: Diagnosis not present

## 2019-03-11 DIAGNOSIS — H35371 Puckering of macula, right eye: Secondary | ICD-10-CM | POA: Diagnosis not present

## 2019-03-11 DIAGNOSIS — Z947 Corneal transplant status: Secondary | ICD-10-CM | POA: Diagnosis not present

## 2019-03-11 DIAGNOSIS — H2513 Age-related nuclear cataract, bilateral: Secondary | ICD-10-CM | POA: Diagnosis not present

## 2019-03-11 DIAGNOSIS — H35033 Hypertensive retinopathy, bilateral: Secondary | ICD-10-CM | POA: Diagnosis not present

## 2019-03-13 DIAGNOSIS — J449 Chronic obstructive pulmonary disease, unspecified: Secondary | ICD-10-CM | POA: Diagnosis not present

## 2019-03-13 DIAGNOSIS — G4709 Other insomnia: Secondary | ICD-10-CM | POA: Diagnosis not present

## 2019-03-13 DIAGNOSIS — N939 Abnormal uterine and vaginal bleeding, unspecified: Secondary | ICD-10-CM | POA: Diagnosis not present

## 2019-03-13 DIAGNOSIS — M25552 Pain in left hip: Secondary | ICD-10-CM | POA: Diagnosis not present

## 2019-03-13 DIAGNOSIS — I10 Essential (primary) hypertension: Secondary | ICD-10-CM | POA: Diagnosis not present

## 2019-03-13 DIAGNOSIS — G4733 Obstructive sleep apnea (adult) (pediatric): Secondary | ICD-10-CM | POA: Diagnosis not present

## 2019-03-14 DIAGNOSIS — M5410 Radiculopathy, site unspecified: Secondary | ICD-10-CM | POA: Diagnosis not present

## 2019-04-03 DIAGNOSIS — M47896 Other spondylosis, lumbar region: Secondary | ICD-10-CM | POA: Diagnosis not present

## 2019-04-03 DIAGNOSIS — M5416 Radiculopathy, lumbar region: Secondary | ICD-10-CM | POA: Diagnosis not present

## 2019-04-03 DIAGNOSIS — M48061 Spinal stenosis, lumbar region without neurogenic claudication: Secondary | ICD-10-CM | POA: Diagnosis not present

## 2019-04-03 DIAGNOSIS — M7138 Other bursal cyst, other site: Secondary | ICD-10-CM | POA: Diagnosis not present

## 2019-04-09 DIAGNOSIS — M47819 Spondylosis without myelopathy or radiculopathy, site unspecified: Secondary | ICD-10-CM | POA: Diagnosis not present

## 2019-04-09 DIAGNOSIS — M5410 Radiculopathy, site unspecified: Secondary | ICD-10-CM | POA: Diagnosis not present

## 2019-04-22 DIAGNOSIS — Z9889 Other specified postprocedural states: Secondary | ICD-10-CM | POA: Diagnosis not present

## 2019-04-22 DIAGNOSIS — Z86018 Personal history of other benign neoplasm: Secondary | ICD-10-CM | POA: Diagnosis not present

## 2019-04-22 DIAGNOSIS — N939 Abnormal uterine and vaginal bleeding, unspecified: Secondary | ICD-10-CM | POA: Diagnosis not present

## 2019-04-23 DIAGNOSIS — Z947 Corneal transplant status: Secondary | ICD-10-CM | POA: Diagnosis not present

## 2019-04-23 DIAGNOSIS — H2513 Age-related nuclear cataract, bilateral: Secondary | ICD-10-CM | POA: Diagnosis not present

## 2019-04-23 DIAGNOSIS — M5410 Radiculopathy, site unspecified: Secondary | ICD-10-CM | POA: Diagnosis not present

## 2019-04-23 DIAGNOSIS — M47819 Spondylosis without myelopathy or radiculopathy, site unspecified: Secondary | ICD-10-CM | POA: Diagnosis not present

## 2019-04-23 DIAGNOSIS — H21543 Posterior synechiae (iris), bilateral: Secondary | ICD-10-CM | POA: Diagnosis not present

## 2019-04-23 DIAGNOSIS — M545 Low back pain: Secondary | ICD-10-CM | POA: Diagnosis not present

## 2019-04-23 DIAGNOSIS — G8929 Other chronic pain: Secondary | ICD-10-CM | POA: Diagnosis not present

## 2019-04-24 DIAGNOSIS — I10 Essential (primary) hypertension: Secondary | ICD-10-CM | POA: Diagnosis not present

## 2019-04-24 DIAGNOSIS — D72819 Decreased white blood cell count, unspecified: Secondary | ICD-10-CM | POA: Diagnosis not present

## 2019-04-24 DIAGNOSIS — I5032 Chronic diastolic (congestive) heart failure: Secondary | ICD-10-CM | POA: Diagnosis not present

## 2019-04-24 DIAGNOSIS — D860 Sarcoidosis of lung: Secondary | ICD-10-CM | POA: Diagnosis not present

## 2019-05-01 DIAGNOSIS — I10 Essential (primary) hypertension: Secondary | ICD-10-CM | POA: Diagnosis not present

## 2019-05-01 DIAGNOSIS — M5416 Radiculopathy, lumbar region: Secondary | ICD-10-CM | POA: Diagnosis not present

## 2019-05-01 DIAGNOSIS — M545 Low back pain: Secondary | ICD-10-CM | POA: Diagnosis not present

## 2019-05-01 DIAGNOSIS — S299XXA Unspecified injury of thorax, initial encounter: Secondary | ICD-10-CM | POA: Diagnosis not present

## 2019-05-01 DIAGNOSIS — Z79899 Other long term (current) drug therapy: Secondary | ICD-10-CM | POA: Diagnosis not present

## 2019-05-01 DIAGNOSIS — R079 Chest pain, unspecified: Secondary | ICD-10-CM | POA: Diagnosis not present

## 2019-05-09 DIAGNOSIS — H2513 Age-related nuclear cataract, bilateral: Secondary | ICD-10-CM | POA: Diagnosis not present

## 2019-05-09 DIAGNOSIS — Z9889 Other specified postprocedural states: Secondary | ICD-10-CM | POA: Diagnosis not present

## 2019-05-09 DIAGNOSIS — H44113 Panuveitis, bilateral: Secondary | ICD-10-CM | POA: Diagnosis not present

## 2019-05-09 DIAGNOSIS — H21543 Posterior synechiae (iris), bilateral: Secondary | ICD-10-CM | POA: Diagnosis not present

## 2019-05-09 DIAGNOSIS — H35033 Hypertensive retinopathy, bilateral: Secondary | ICD-10-CM | POA: Diagnosis not present

## 2019-05-09 DIAGNOSIS — H30033 Focal chorioretinal inflammation, peripheral, bilateral: Secondary | ICD-10-CM | POA: Diagnosis not present

## 2019-05-09 DIAGNOSIS — Z79899 Other long term (current) drug therapy: Secondary | ICD-10-CM | POA: Diagnosis not present

## 2019-05-09 DIAGNOSIS — Z947 Corneal transplant status: Secondary | ICD-10-CM | POA: Diagnosis not present

## 2019-05-09 DIAGNOSIS — D869 Sarcoidosis, unspecified: Secondary | ICD-10-CM | POA: Diagnosis not present

## 2019-05-09 DIAGNOSIS — H35371 Puckering of macula, right eye: Secondary | ICD-10-CM | POA: Diagnosis not present

## 2019-05-12 DIAGNOSIS — I110 Hypertensive heart disease with heart failure: Secondary | ICD-10-CM | POA: Diagnosis not present

## 2019-05-12 DIAGNOSIS — I5032 Chronic diastolic (congestive) heart failure: Secondary | ICD-10-CM | POA: Diagnosis not present

## 2019-05-20 DIAGNOSIS — Z6838 Body mass index (BMI) 38.0-38.9, adult: Secondary | ICD-10-CM | POA: Diagnosis not present

## 2019-05-20 DIAGNOSIS — N939 Abnormal uterine and vaginal bleeding, unspecified: Secondary | ICD-10-CM | POA: Diagnosis not present

## 2019-05-20 DIAGNOSIS — Z86018 Personal history of other benign neoplasm: Secondary | ICD-10-CM | POA: Diagnosis not present

## 2019-05-20 DIAGNOSIS — Z9889 Other specified postprocedural states: Secondary | ICD-10-CM | POA: Diagnosis not present

## 2019-06-05 DIAGNOSIS — I10 Essential (primary) hypertension: Secondary | ICD-10-CM | POA: Diagnosis not present

## 2019-06-05 DIAGNOSIS — G4733 Obstructive sleep apnea (adult) (pediatric): Secondary | ICD-10-CM | POA: Diagnosis not present

## 2019-06-05 DIAGNOSIS — D72819 Decreased white blood cell count, unspecified: Secondary | ICD-10-CM | POA: Diagnosis not present

## 2019-06-05 DIAGNOSIS — E785 Hyperlipidemia, unspecified: Secondary | ICD-10-CM | POA: Diagnosis not present

## 2019-06-05 DIAGNOSIS — G4709 Other insomnia: Secondary | ICD-10-CM | POA: Diagnosis not present

## 2019-06-05 DIAGNOSIS — I5032 Chronic diastolic (congestive) heart failure: Secondary | ICD-10-CM | POA: Diagnosis not present

## 2019-06-24 DIAGNOSIS — D259 Leiomyoma of uterus, unspecified: Secondary | ICD-10-CM | POA: Diagnosis not present

## 2019-06-24 DIAGNOSIS — N939 Abnormal uterine and vaginal bleeding, unspecified: Secondary | ICD-10-CM | POA: Diagnosis not present

## 2019-07-02 DIAGNOSIS — N939 Abnormal uterine and vaginal bleeding, unspecified: Secondary | ICD-10-CM | POA: Diagnosis not present

## 2019-07-02 DIAGNOSIS — D250 Submucous leiomyoma of uterus: Secondary | ICD-10-CM | POA: Diagnosis not present

## 2019-07-02 DIAGNOSIS — N921 Excessive and frequent menstruation with irregular cycle: Secondary | ICD-10-CM | POA: Diagnosis not present

## 2019-07-02 DIAGNOSIS — I10 Essential (primary) hypertension: Secondary | ICD-10-CM | POA: Diagnosis not present

## 2019-07-11 DIAGNOSIS — G4733 Obstructive sleep apnea (adult) (pediatric): Secondary | ICD-10-CM | POA: Diagnosis not present

## 2019-07-11 DIAGNOSIS — M479 Spondylosis, unspecified: Secondary | ICD-10-CM | POA: Diagnosis not present

## 2019-07-22 DIAGNOSIS — Z79899 Other long term (current) drug therapy: Secondary | ICD-10-CM | POA: Diagnosis not present

## 2019-07-22 DIAGNOSIS — H35371 Puckering of macula, right eye: Secondary | ICD-10-CM | POA: Diagnosis not present

## 2019-07-22 DIAGNOSIS — H21543 Posterior synechiae (iris), bilateral: Secondary | ICD-10-CM | POA: Diagnosis not present

## 2019-07-22 DIAGNOSIS — H2513 Age-related nuclear cataract, bilateral: Secondary | ICD-10-CM | POA: Diagnosis not present

## 2019-07-22 DIAGNOSIS — D869 Sarcoidosis, unspecified: Secondary | ICD-10-CM | POA: Diagnosis not present

## 2019-07-22 DIAGNOSIS — H30033 Focal chorioretinal inflammation, peripheral, bilateral: Secondary | ICD-10-CM | POA: Diagnosis not present

## 2019-07-22 DIAGNOSIS — Z947 Corneal transplant status: Secondary | ICD-10-CM | POA: Diagnosis not present

## 2019-07-22 DIAGNOSIS — H35033 Hypertensive retinopathy, bilateral: Secondary | ICD-10-CM | POA: Diagnosis not present

## 2019-07-22 DIAGNOSIS — H44113 Panuveitis, bilateral: Secondary | ICD-10-CM | POA: Diagnosis not present

## 2019-07-23 DIAGNOSIS — D250 Submucous leiomyoma of uterus: Secondary | ICD-10-CM | POA: Diagnosis not present

## 2019-07-23 DIAGNOSIS — I10 Essential (primary) hypertension: Secondary | ICD-10-CM | POA: Diagnosis not present

## 2019-07-23 DIAGNOSIS — N921 Excessive and frequent menstruation with irregular cycle: Secondary | ICD-10-CM | POA: Diagnosis not present

## 2019-08-05 DIAGNOSIS — R05 Cough: Secondary | ICD-10-CM | POA: Diagnosis not present

## 2019-08-05 DIAGNOSIS — D869 Sarcoidosis, unspecified: Secondary | ICD-10-CM | POA: Diagnosis not present

## 2019-08-05 DIAGNOSIS — Z7189 Other specified counseling: Secondary | ICD-10-CM | POA: Diagnosis not present

## 2019-08-06 DIAGNOSIS — J029 Acute pharyngitis, unspecified: Secondary | ICD-10-CM | POA: Diagnosis not present

## 2019-08-06 DIAGNOSIS — J019 Acute sinusitis, unspecified: Secondary | ICD-10-CM | POA: Diagnosis not present

## 2019-08-06 DIAGNOSIS — R05 Cough: Secondary | ICD-10-CM | POA: Diagnosis not present

## 2019-08-06 DIAGNOSIS — R0600 Dyspnea, unspecified: Secondary | ICD-10-CM | POA: Diagnosis not present

## 2019-08-06 DIAGNOSIS — J209 Acute bronchitis, unspecified: Secondary | ICD-10-CM | POA: Diagnosis not present

## 2019-08-06 DIAGNOSIS — Z20828 Contact with and (suspected) exposure to other viral communicable diseases: Secondary | ICD-10-CM | POA: Diagnosis not present

## 2019-09-12 DIAGNOSIS — H2513 Age-related nuclear cataract, bilateral: Secondary | ICD-10-CM | POA: Diagnosis not present

## 2019-09-12 DIAGNOSIS — Z947 Corneal transplant status: Secondary | ICD-10-CM | POA: Diagnosis not present

## 2019-09-12 DIAGNOSIS — Z01812 Encounter for preprocedural laboratory examination: Secondary | ICD-10-CM | POA: Diagnosis not present

## 2019-09-12 DIAGNOSIS — H21543 Posterior synechiae (iris), bilateral: Secondary | ICD-10-CM | POA: Diagnosis not present

## 2019-09-12 DIAGNOSIS — Z20828 Contact with and (suspected) exposure to other viral communicable diseases: Secondary | ICD-10-CM | POA: Diagnosis not present

## 2019-09-12 DIAGNOSIS — H30033 Focal chorioretinal inflammation, peripheral, bilateral: Secondary | ICD-10-CM | POA: Diagnosis not present

## 2019-09-18 DIAGNOSIS — H25811 Combined forms of age-related cataract, right eye: Secondary | ICD-10-CM | POA: Diagnosis not present

## 2019-09-18 DIAGNOSIS — H5789 Other specified disorders of eye and adnexa: Secondary | ICD-10-CM | POA: Diagnosis not present

## 2019-10-14 DIAGNOSIS — G4733 Obstructive sleep apnea (adult) (pediatric): Secondary | ICD-10-CM | POA: Diagnosis not present

## 2019-10-26 DIAGNOSIS — I10 Essential (primary) hypertension: Secondary | ICD-10-CM | POA: Diagnosis not present

## 2019-10-26 DIAGNOSIS — Z6839 Body mass index (BMI) 39.0-39.9, adult: Secondary | ICD-10-CM | POA: Diagnosis not present

## 2019-10-26 DIAGNOSIS — E669 Obesity, unspecified: Secondary | ICD-10-CM | POA: Diagnosis not present

## 2019-10-26 DIAGNOSIS — G479 Sleep disorder, unspecified: Secondary | ICD-10-CM | POA: Diagnosis not present

## 2019-10-26 DIAGNOSIS — G4733 Obstructive sleep apnea (adult) (pediatric): Secondary | ICD-10-CM | POA: Diagnosis not present

## 2019-10-26 DIAGNOSIS — R0683 Snoring: Secondary | ICD-10-CM | POA: Diagnosis not present
